# Patient Record
Sex: Female | Born: 1980 | Race: Black or African American | Hispanic: No | Marital: Single | Smoking: Never smoker
Health system: Southern US, Community
[De-identification: ages and names within clinical notes are randomized; demographics above are authoritative.]

## PROBLEM LIST (undated history)

## (undated) DIAGNOSIS — R519 Headache, unspecified: Secondary | ICD-10-CM

## (undated) DIAGNOSIS — L409 Psoriasis, unspecified: Secondary | ICD-10-CM

## (undated) DIAGNOSIS — L309 Dermatitis, unspecified: Secondary | ICD-10-CM

## (undated) HISTORY — PX: DILATION AND CURETTAGE OF UTERUS: SHX78

## (undated) HISTORY — DX: Dermatitis, unspecified: L30.9

---

## 2007-07-26 ENCOUNTER — Emergency Department: Payer: Self-pay | Admitting: Emergency Medicine

## 2007-08-10 ENCOUNTER — Emergency Department: Payer: Self-pay | Admitting: Emergency Medicine

## 2007-08-12 ENCOUNTER — Ambulatory Visit: Payer: Self-pay

## 2007-08-25 ENCOUNTER — Emergency Department: Payer: Self-pay | Admitting: Emergency Medicine

## 2007-09-10 ENCOUNTER — Other Ambulatory Visit: Payer: Self-pay

## 2007-09-10 ENCOUNTER — Emergency Department: Payer: Self-pay | Admitting: Emergency Medicine

## 2007-09-13 ENCOUNTER — Emergency Department: Payer: Self-pay

## 2007-10-11 ENCOUNTER — Emergency Department: Payer: Self-pay | Admitting: Emergency Medicine

## 2007-10-14 ENCOUNTER — Ambulatory Visit: Payer: Self-pay | Admitting: Unknown Physician Specialty

## 2007-10-18 ENCOUNTER — Emergency Department: Payer: Self-pay | Admitting: Internal Medicine

## 2007-10-30 ENCOUNTER — Emergency Department: Payer: Self-pay | Admitting: Emergency Medicine

## 2007-11-05 ENCOUNTER — Ambulatory Visit: Payer: Self-pay | Admitting: Emergency Medicine

## 2007-11-30 ENCOUNTER — Emergency Department: Payer: Self-pay | Admitting: Emergency Medicine

## 2007-12-10 ENCOUNTER — Emergency Department: Payer: Self-pay | Admitting: Emergency Medicine

## 2007-12-27 ENCOUNTER — Emergency Department: Payer: Self-pay | Admitting: Emergency Medicine

## 2007-12-28 ENCOUNTER — Emergency Department: Payer: Self-pay | Admitting: Emergency Medicine

## 2008-02-03 ENCOUNTER — Observation Stay: Payer: Self-pay | Admitting: Obstetrics and Gynecology

## 2008-02-15 ENCOUNTER — Observation Stay: Payer: Self-pay

## 2008-02-25 ENCOUNTER — Observation Stay: Payer: Self-pay

## 2008-03-16 ENCOUNTER — Observation Stay: Payer: Self-pay

## 2008-03-20 ENCOUNTER — Observation Stay: Payer: Self-pay

## 2008-04-25 ENCOUNTER — Observation Stay: Payer: Self-pay

## 2008-04-29 ENCOUNTER — Observation Stay: Payer: Self-pay | Admitting: Unknown Physician Specialty

## 2008-04-30 ENCOUNTER — Observation Stay: Payer: Self-pay | Admitting: Unknown Physician Specialty

## 2008-05-15 ENCOUNTER — Observation Stay: Payer: Self-pay

## 2008-05-18 ENCOUNTER — Observation Stay: Payer: Self-pay

## 2008-05-25 ENCOUNTER — Observation Stay: Payer: Self-pay | Admitting: Obstetrics and Gynecology

## 2008-05-31 ENCOUNTER — Inpatient Hospital Stay: Payer: Self-pay

## 2008-06-17 ENCOUNTER — Emergency Department: Payer: Self-pay | Admitting: Unknown Physician Specialty

## 2008-09-03 ENCOUNTER — Emergency Department: Payer: Self-pay | Admitting: Emergency Medicine

## 2009-05-14 ENCOUNTER — Emergency Department: Payer: Self-pay | Admitting: Emergency Medicine

## 2009-06-10 IMAGING — US US OB < 14 WEEKS - US OB TV
1 series · 17 of 28 positions shown · non-contrast
Comparison: none

REASON FOR EXAM: Rule out ectopic, AB
COMMENTS:

[Series 1: us ob < 14 weeks - us ob tv · 17 of 86 slices shown]
[im 1/86]
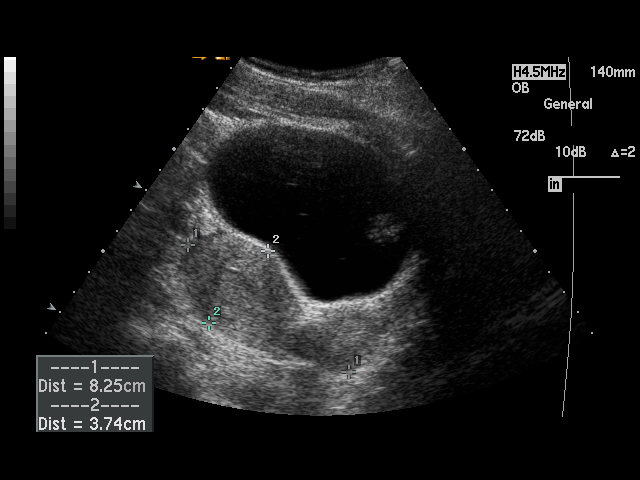
[im 7/86]
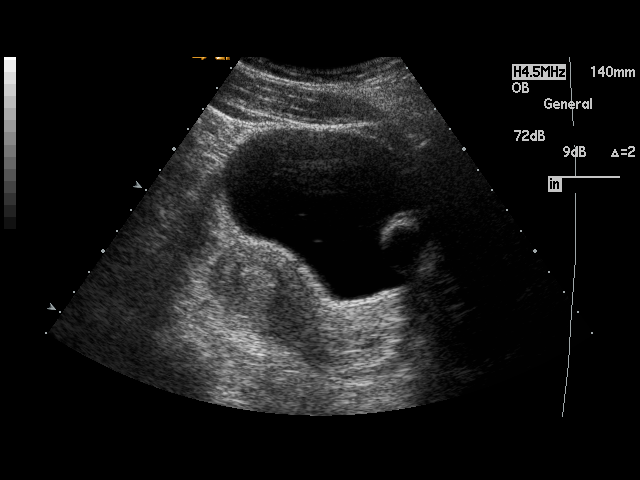
[im 13/86]
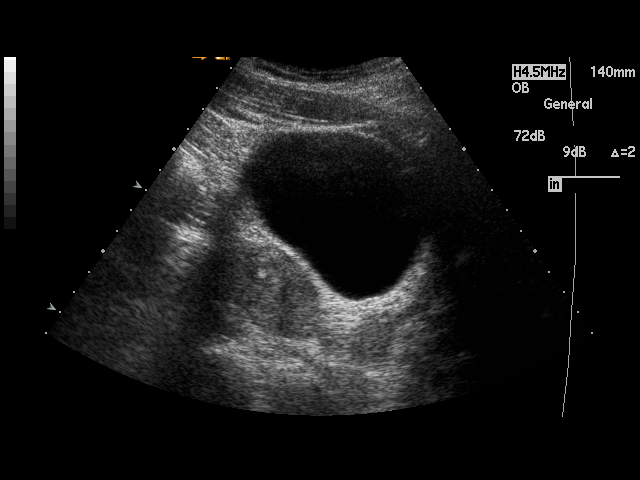
[im 16/86]
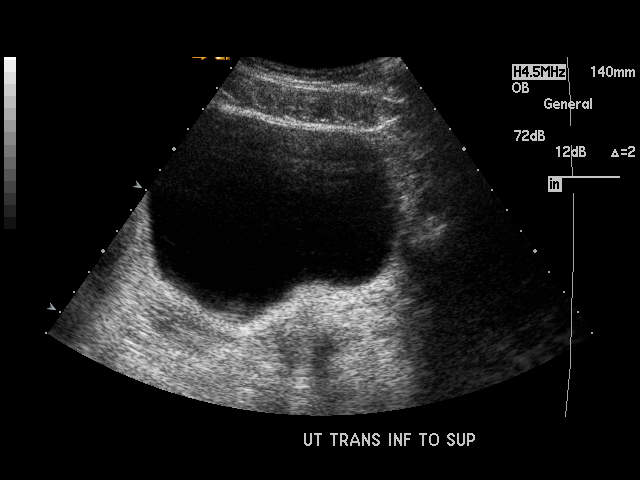
[im 23/86]
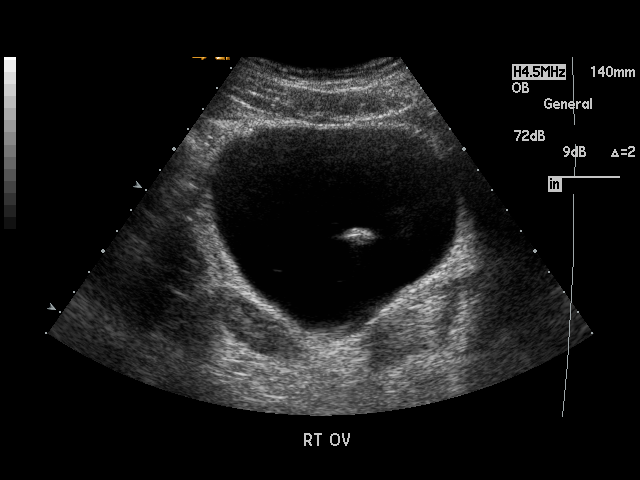
[im 29/86]
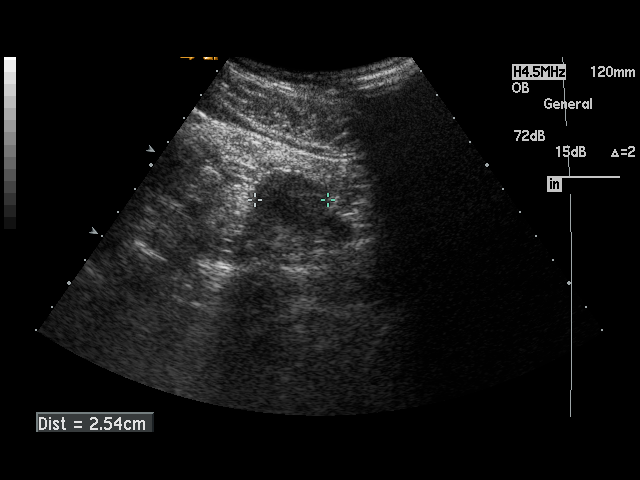
[im 32/86]
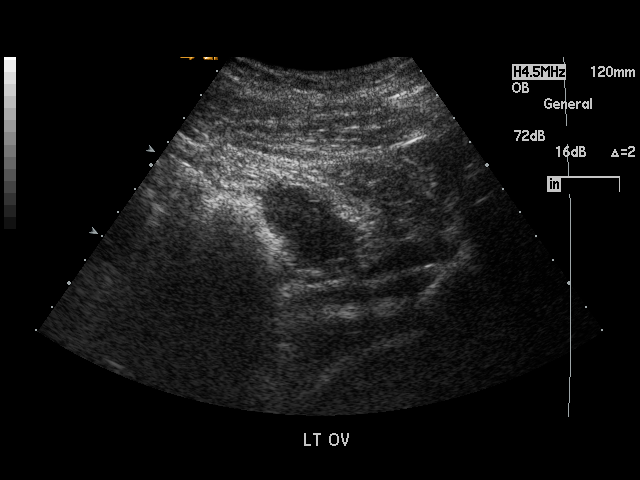
[im 38/86]
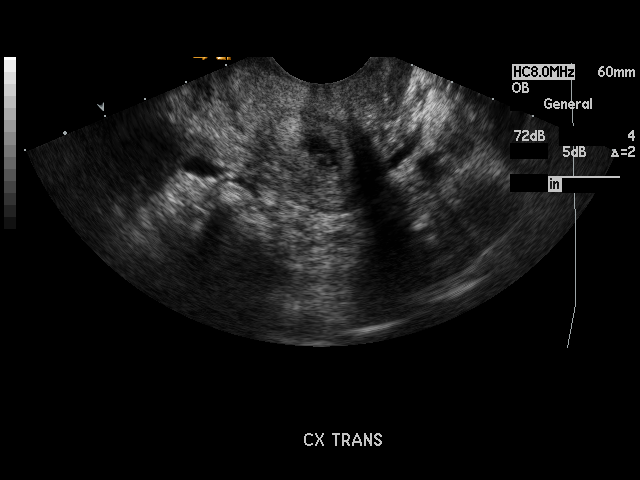
[im 45/86]
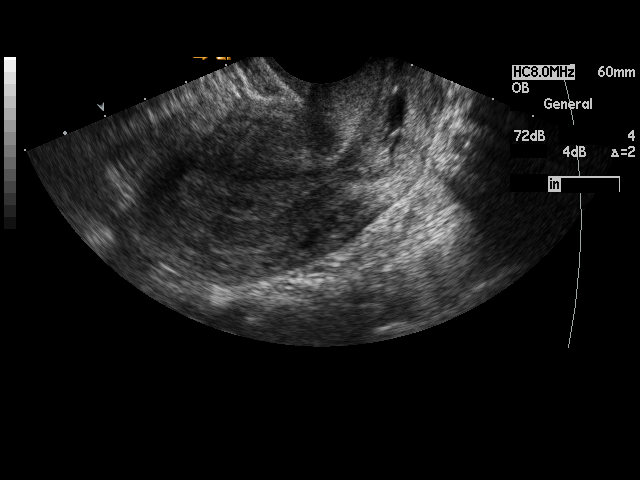
[im 48/86]
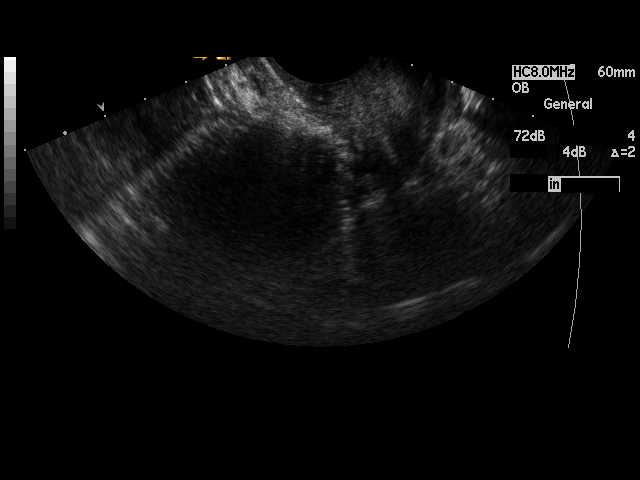
[im 54/86]
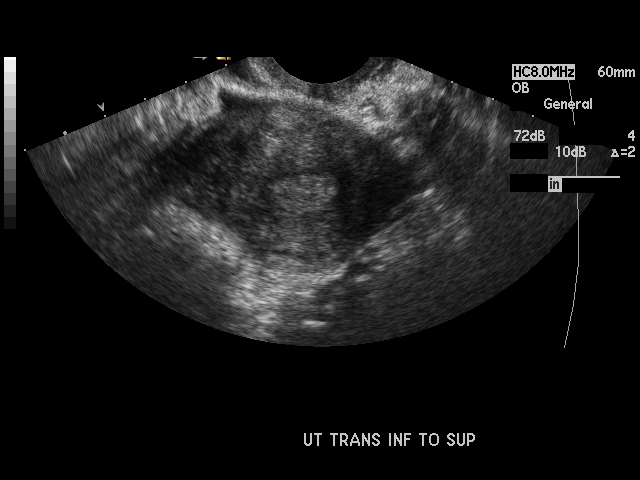
[im 57/86]
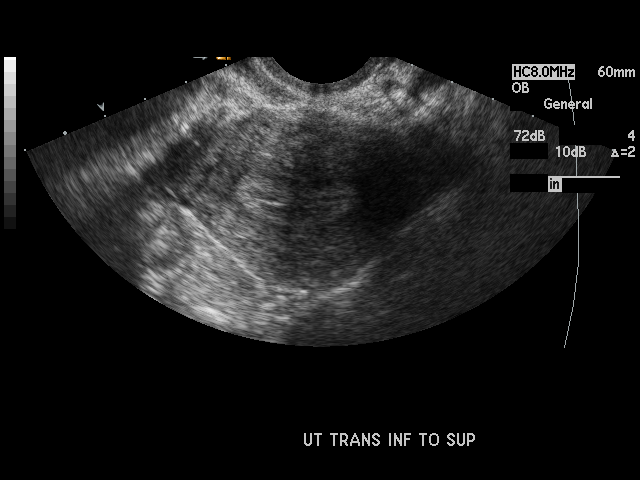
[im 63/86]
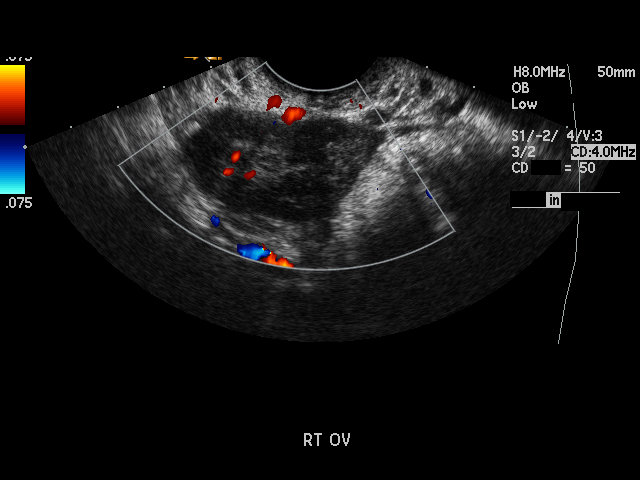
[im 70/86]
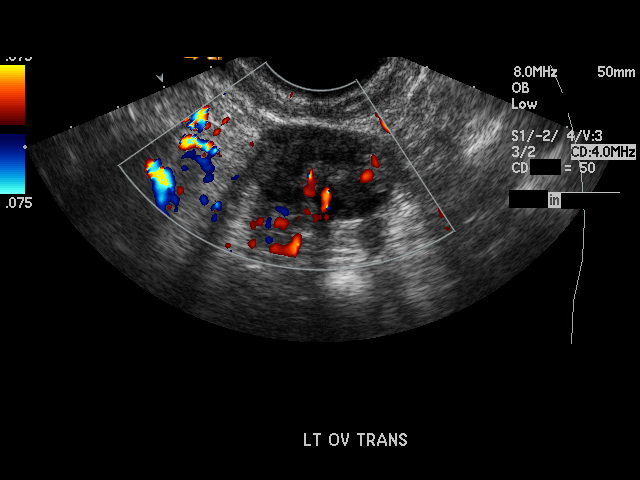
[im 73/86]
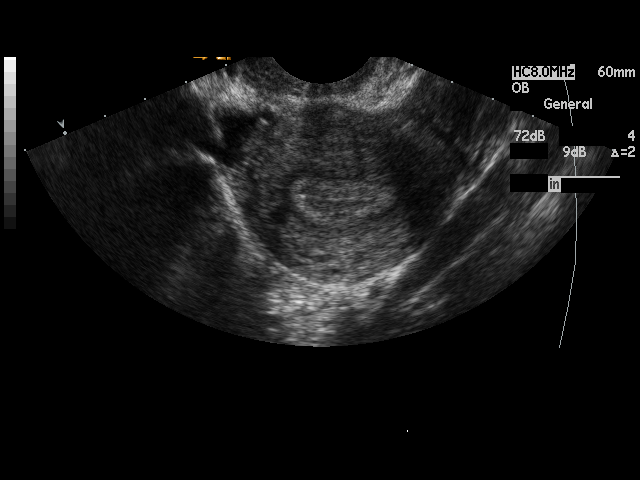
[im 79/86]
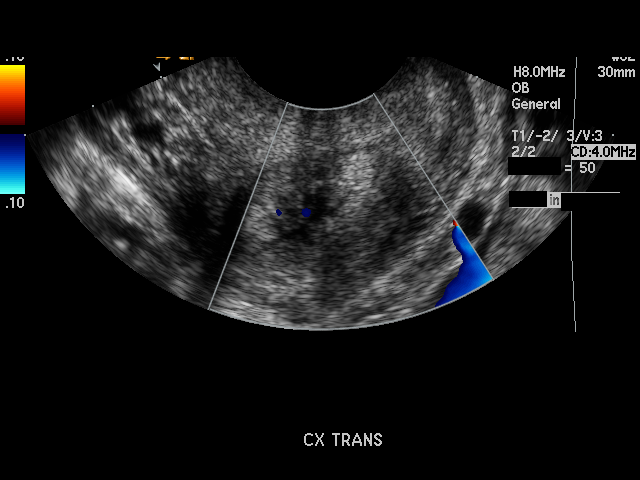
[im 86/86]
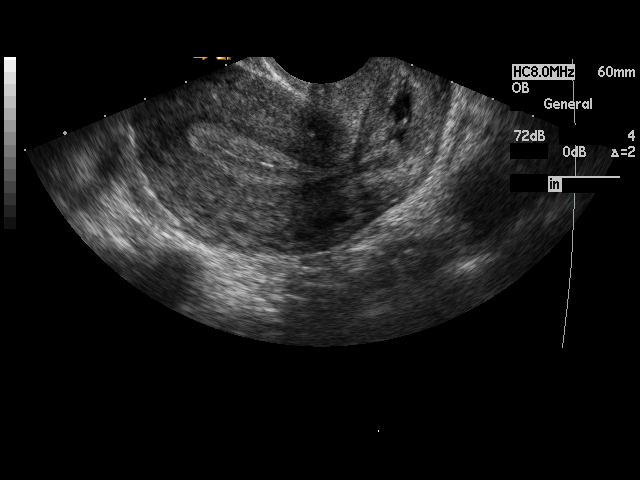

[17 of 28 positions shown; findings below may reference images not displayed]

PROCEDURE:     US  - US OB LESS THAN 14 WEEKS  - August 10, 2007 [DATE]

RESULT:     The patient is in the early trimester of pregnancy and is
spotting.

There are findings that likely reflect fetal parts in the endocervical
canal. There is a structure that may reflect a tiny yolk sac with fetal
pole. No cardiac activity is seen. The endometrial stripe is mildly
thickened at approximately 12.0 mm. No endometrial fluid collection is
identified. The ovaries are normal in appearance. No cystic or solid mass in
the adnexal regions is seen.
IMPRESSION: There are findings consistent with an impending
miscarriage. The fetal parts are felt to likely lie within the endocervical
canal. No fetal cardiac activity is identified.

This report was called to the [HOSPITAL] the conclusion of the
study.

## 2009-09-05 IMAGING — US US OB < 14 WEEKS - US OB TV
1 series · 14 of 28 positions shown · non-contrast
Comparison: none

REASON FOR EXAM: pelvic pain
COMMENTS:

[Series 1: us ob < 14 weeks - us ob tv · 0.14mm/px · 14 of 34 slices shown]
[im 2/34]
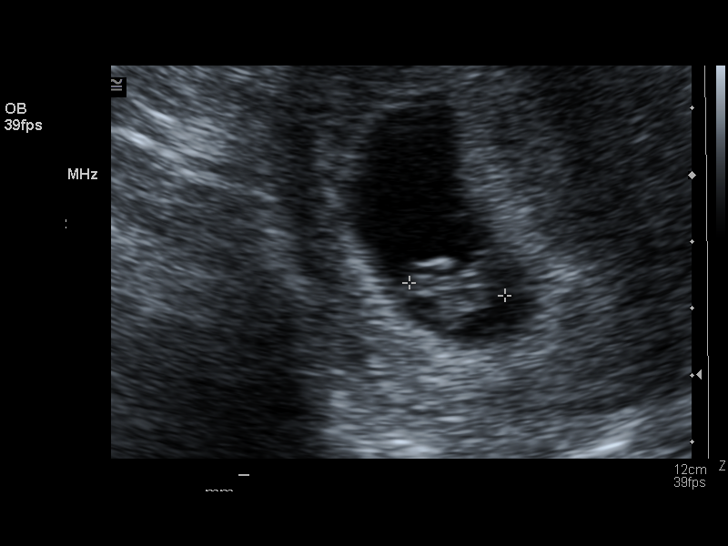
[im 4/34]
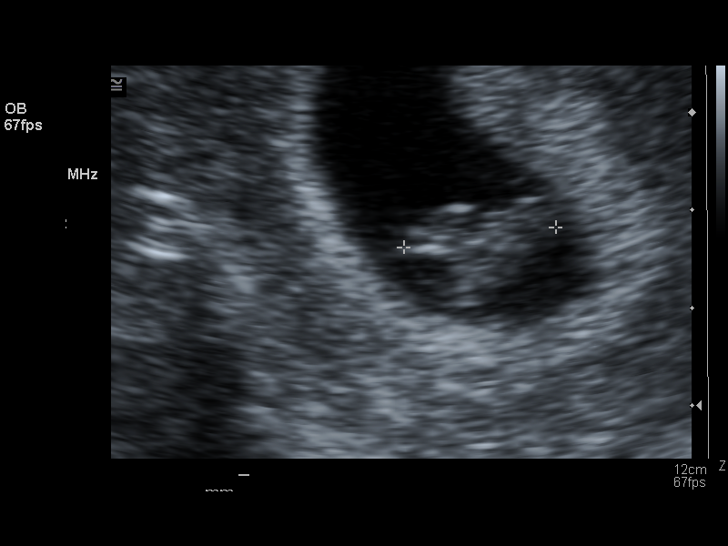
[im 7/34]
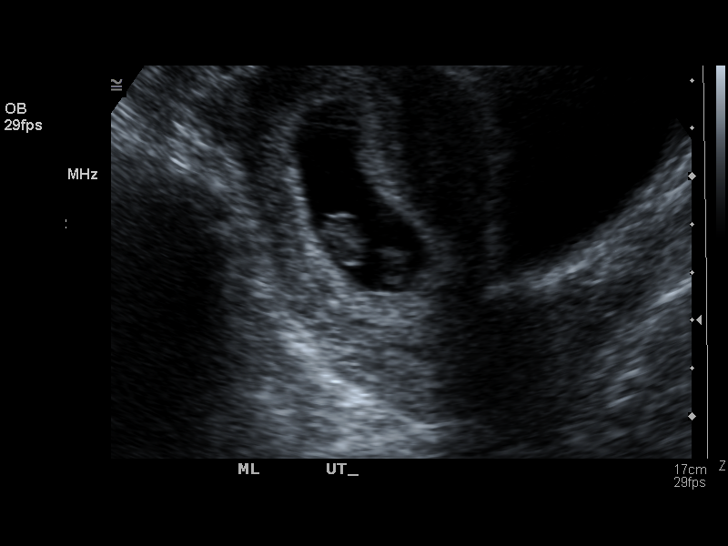
[im 9/34]
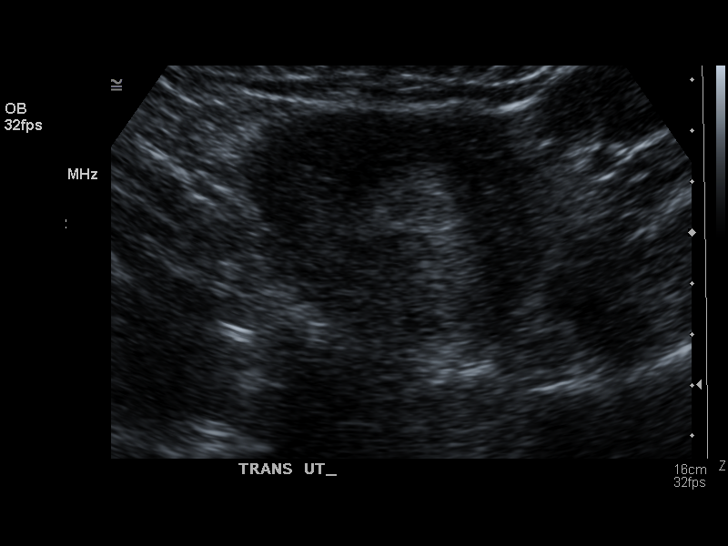
[im 12/34]
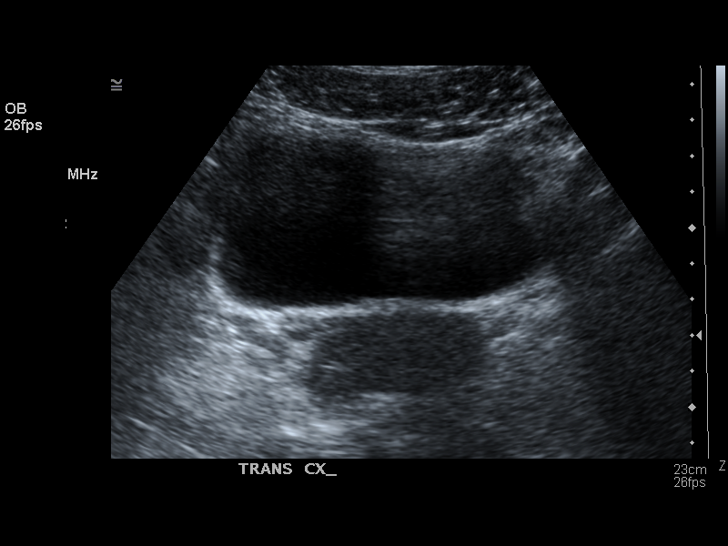
[im 14/34]
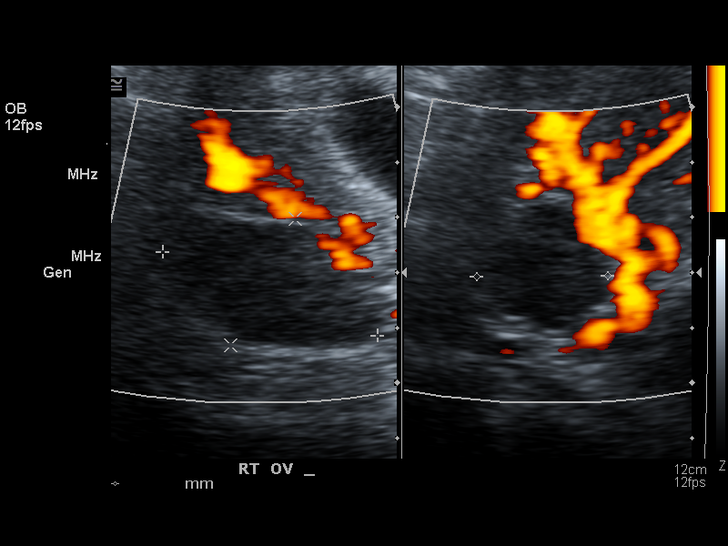
[im 16/34]
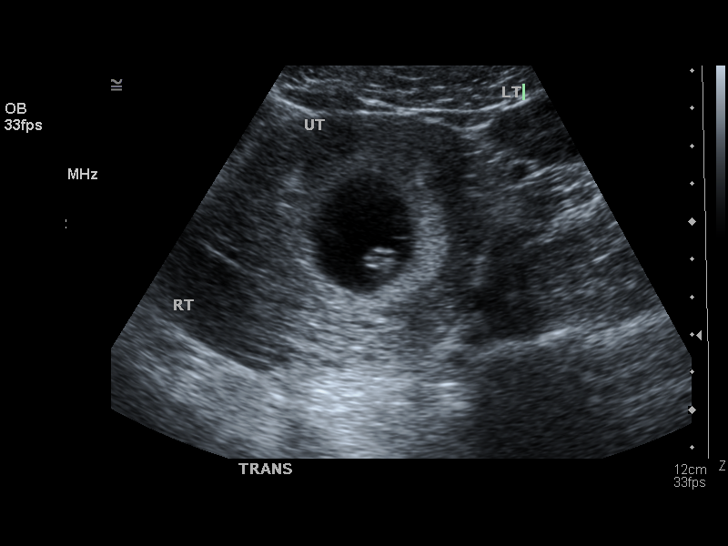
[im 19/34]
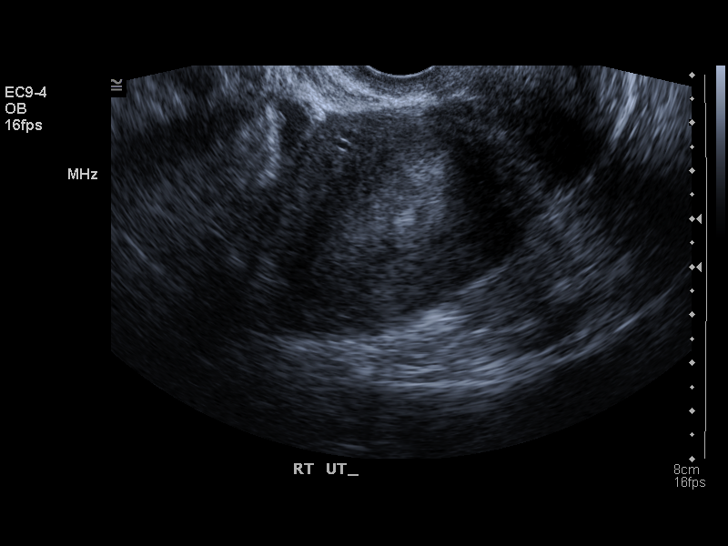
[im 21/34]
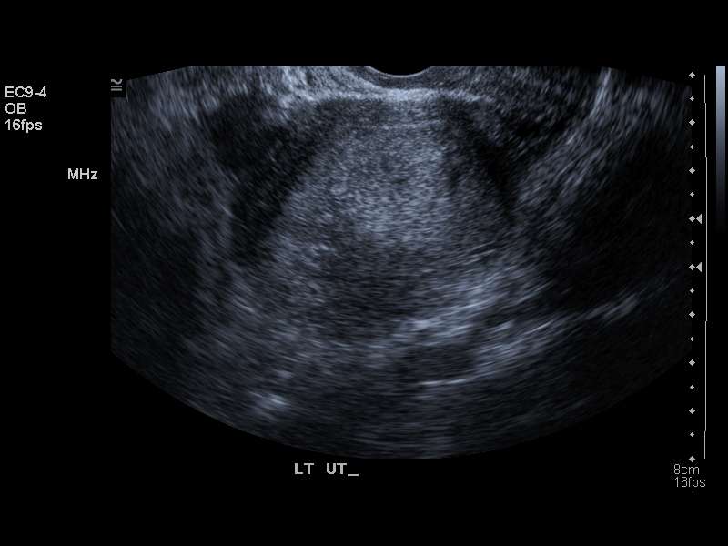
[im 24/34]
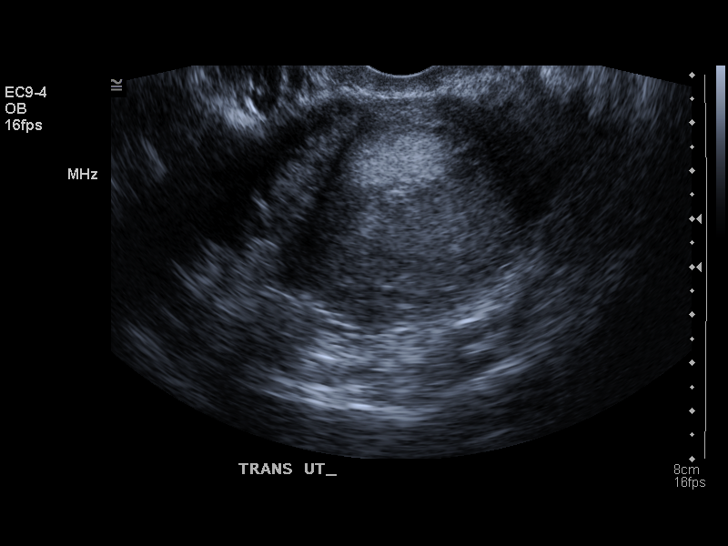
[im 26/34]
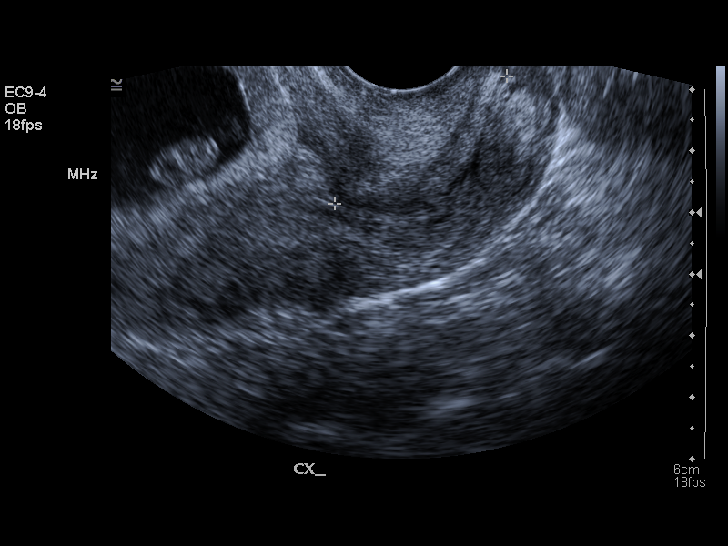
[im 29/34]
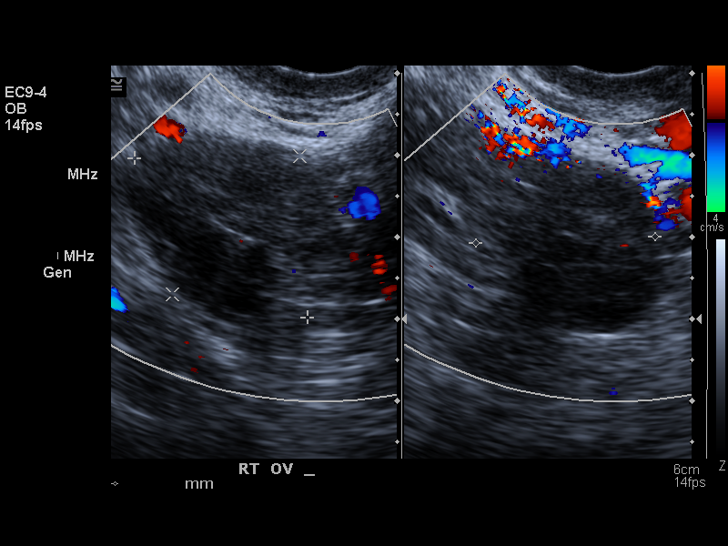
[im 31/34]
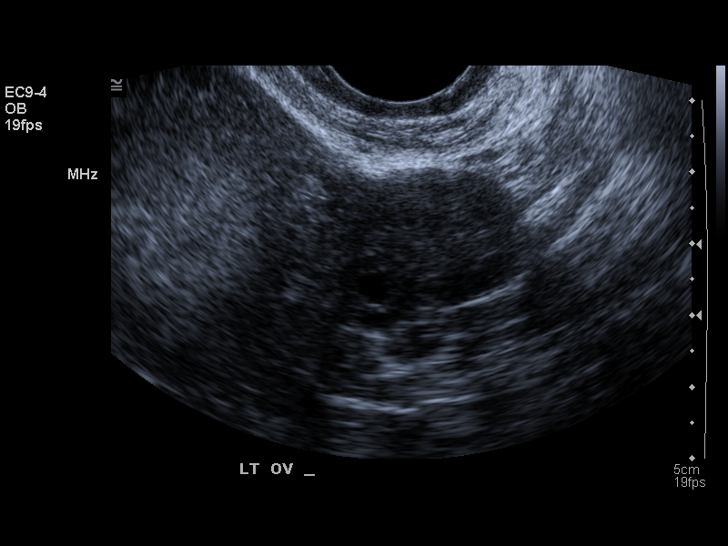
[im 34/34]
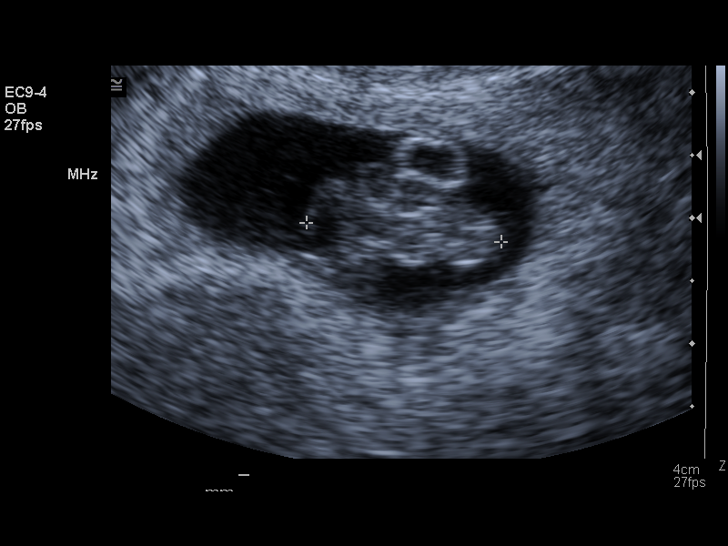

[14 of 28 positions shown; findings below may reference images not displayed]

PROCEDURE:     US  - US OB LESS THAN 14 WEEKS  - November 05, 2007  [DATE]

RESULT:     There is a gravid uterus present. The crown-rump length measures
15.7 mm corresponding to an 8 week-0 day gestation. Cardiac activity at a
rate of 168 beats per minute was demonstrated. There is no evidence of
subchorionic hemorrhage. The maternal ovaries are normal in appearance.
IMPRESSION: 1.There is a viable IUP with gestational age of approximately 8 weeks-3 days
with an estimated date of confinement 16 June, 2008. Follow-up [HOSPITAL]
approximately 20 weeks is recommended. No free fluid is seen in the maternal
pelvis and the adnexal structures are normal in appearance.

## 2010-04-29 ENCOUNTER — Emergency Department: Payer: Self-pay | Admitting: Emergency Medicine

## 2016-12-31 DIAGNOSIS — B9789 Other viral agents as the cause of diseases classified elsewhere: Secondary | ICD-10-CM | POA: Diagnosis not present

## 2016-12-31 DIAGNOSIS — J069 Acute upper respiratory infection, unspecified: Secondary | ICD-10-CM | POA: Diagnosis not present

## 2017-01-14 DIAGNOSIS — L219 Seborrheic dermatitis, unspecified: Secondary | ICD-10-CM | POA: Diagnosis not present

## 2017-01-14 DIAGNOSIS — L400 Psoriasis vulgaris: Secondary | ICD-10-CM | POA: Diagnosis not present

## 2017-03-20 DIAGNOSIS — M545 Low back pain: Secondary | ICD-10-CM | POA: Diagnosis not present

## 2017-04-16 DIAGNOSIS — Z0000 Encounter for general adult medical examination without abnormal findings: Secondary | ICD-10-CM | POA: Diagnosis not present

## 2017-04-16 DIAGNOSIS — Z131 Encounter for screening for diabetes mellitus: Secondary | ICD-10-CM | POA: Diagnosis not present

## 2017-04-16 DIAGNOSIS — Z124 Encounter for screening for malignant neoplasm of cervix: Secondary | ICD-10-CM | POA: Diagnosis not present

## 2017-05-18 DIAGNOSIS — H60339 Swimmer's ear, unspecified ear: Secondary | ICD-10-CM | POA: Diagnosis not present

## 2017-07-02 DIAGNOSIS — H6123 Impacted cerumen, bilateral: Secondary | ICD-10-CM | POA: Diagnosis not present

## 2017-07-23 DIAGNOSIS — M79671 Pain in right foot: Secondary | ICD-10-CM | POA: Diagnosis not present

## 2017-08-07 DIAGNOSIS — M7061 Trochanteric bursitis, right hip: Secondary | ICD-10-CM | POA: Diagnosis not present

## 2017-08-29 DIAGNOSIS — H6121 Impacted cerumen, right ear: Secondary | ICD-10-CM | POA: Diagnosis not present

## 2017-09-14 DIAGNOSIS — J019 Acute sinusitis, unspecified: Secondary | ICD-10-CM | POA: Diagnosis not present

## 2017-09-28 DIAGNOSIS — M722 Plantar fascial fibromatosis: Secondary | ICD-10-CM | POA: Diagnosis not present

## 2017-10-09 DIAGNOSIS — M659 Synovitis and tenosynovitis, unspecified: Secondary | ICD-10-CM | POA: Diagnosis not present

## 2017-10-09 DIAGNOSIS — M79672 Pain in left foot: Secondary | ICD-10-CM | POA: Diagnosis not present

## 2017-11-27 DIAGNOSIS — H6121 Impacted cerumen, right ear: Secondary | ICD-10-CM | POA: Diagnosis not present

## 2017-12-06 DIAGNOSIS — H9201 Otalgia, right ear: Secondary | ICD-10-CM | POA: Diagnosis not present

## 2017-12-06 DIAGNOSIS — H6121 Impacted cerumen, right ear: Secondary | ICD-10-CM | POA: Diagnosis not present

## 2017-12-31 DIAGNOSIS — H6091 Unspecified otitis externa, right ear: Secondary | ICD-10-CM | POA: Diagnosis not present

## 2018-01-02 DIAGNOSIS — J019 Acute sinusitis, unspecified: Secondary | ICD-10-CM | POA: Diagnosis not present

## 2018-02-20 DIAGNOSIS — J019 Acute sinusitis, unspecified: Secondary | ICD-10-CM | POA: Diagnosis not present

## 2018-02-20 DIAGNOSIS — J42 Unspecified chronic bronchitis: Secondary | ICD-10-CM | POA: Diagnosis not present

## 2018-03-06 DIAGNOSIS — H10029 Other mucopurulent conjunctivitis, unspecified eye: Secondary | ICD-10-CM | POA: Diagnosis not present

## 2018-03-10 DIAGNOSIS — L400 Psoriasis vulgaris: Secondary | ICD-10-CM | POA: Diagnosis not present

## 2018-03-10 DIAGNOSIS — L408 Other psoriasis: Secondary | ICD-10-CM | POA: Diagnosis not present

## 2018-03-10 DIAGNOSIS — L219 Seborrheic dermatitis, unspecified: Secondary | ICD-10-CM | POA: Diagnosis not present

## 2018-04-08 DIAGNOSIS — J069 Acute upper respiratory infection, unspecified: Secondary | ICD-10-CM | POA: Diagnosis not present

## 2018-04-08 DIAGNOSIS — R6889 Other general symptoms and signs: Secondary | ICD-10-CM | POA: Diagnosis not present

## 2018-05-01 DIAGNOSIS — R0981 Nasal congestion: Secondary | ICD-10-CM | POA: Diagnosis not present

## 2018-05-01 DIAGNOSIS — R0982 Postnasal drip: Secondary | ICD-10-CM | POA: Diagnosis not present

## 2018-05-01 DIAGNOSIS — J029 Acute pharyngitis, unspecified: Secondary | ICD-10-CM | POA: Diagnosis not present

## 2018-06-02 DIAGNOSIS — R7303 Prediabetes: Secondary | ICD-10-CM | POA: Insufficient documentation

## 2018-06-02 DIAGNOSIS — Z0000 Encounter for general adult medical examination without abnormal findings: Secondary | ICD-10-CM | POA: Diagnosis not present

## 2018-07-05 DIAGNOSIS — R7303 Prediabetes: Secondary | ICD-10-CM | POA: Diagnosis not present

## 2018-07-19 DIAGNOSIS — N898 Other specified noninflammatory disorders of vagina: Secondary | ICD-10-CM | POA: Diagnosis not present

## 2018-12-06 DIAGNOSIS — Z6834 Body mass index (BMI) 34.0-34.9, adult: Secondary | ICD-10-CM | POA: Diagnosis not present

## 2018-12-06 DIAGNOSIS — R7303 Prediabetes: Secondary | ICD-10-CM | POA: Diagnosis not present

## 2019-03-13 DIAGNOSIS — J069 Acute upper respiratory infection, unspecified: Secondary | ICD-10-CM | POA: Diagnosis not present

## 2019-11-03 ENCOUNTER — Other Ambulatory Visit: Payer: Self-pay

## 2019-11-03 DIAGNOSIS — Z20822 Contact with and (suspected) exposure to covid-19: Secondary | ICD-10-CM

## 2019-11-05 LAB — NOVEL CORONAVIRUS, NAA: SARS-CoV-2, NAA: NOT DETECTED

## 2019-11-22 ENCOUNTER — Ambulatory Visit: Payer: 59 | Attending: Internal Medicine

## 2019-11-22 ENCOUNTER — Other Ambulatory Visit: Payer: Self-pay

## 2019-11-22 DIAGNOSIS — Z20822 Contact with and (suspected) exposure to covid-19: Secondary | ICD-10-CM

## 2019-11-24 LAB — NOVEL CORONAVIRUS, NAA: SARS-CoV-2, NAA: NOT DETECTED

## 2020-03-09 DIAGNOSIS — G44019 Episodic cluster headache, not intractable: Secondary | ICD-10-CM | POA: Insufficient documentation

## 2020-03-09 HISTORY — DX: Episodic cluster headache, not intractable: G44.019

## 2020-03-15 ENCOUNTER — Other Ambulatory Visit: Payer: Self-pay

## 2020-03-15 ENCOUNTER — Ambulatory Visit: Payer: 59 | Admitting: Dermatology

## 2020-03-15 DIAGNOSIS — L409 Psoriasis, unspecified: Secondary | ICD-10-CM

## 2020-03-15 NOTE — Progress Notes (Signed)
   Follow-Up Visit   Subjective  Barbara Baker is a 39 y.o. female who presents for the following: Follow-up (Severe seb derm versus psoriasis- Ciclopirox shampoo q2-3 weeks, Dermasmoothe oil qod. Scalp is not worse. Oil helps a lot.).  Her severe seborrheic dermatitis versus psoriasis treatment has not been satisfactory.  She is unable to shampoo her hair very frequently.  We recommend [place] shampooing it more frequently but she feels like she cannot do that.  We discussed Otezla treatment and she wants to pursue this.  There is strong family history of psoriasis   The following portions of the chart were reviewed this encounter and updated as appropriate: Allergies  Meds  Problems  Med Hx  Surg Hx  Fam Hx      Review of Systems: No other skin or systemic complaints.  Objective  Well appearing patient in no apparent distress; mood and affect are within normal limits.  A focused examination was performed including scalp. Relevant physical exam findings are noted in the Assessment and Plan.  Objective  Scalp: Thick scale of scalp.  Assessment & Plan  Psoriasis Scalp Not doing well on current therapy and is flared.  She wants to discuss more aggressive treatment.  This involves systemic treatment with side effects we discussed this in detail.  Discussed Otezla vs Biologic treatments. Recommend starting Otezla. Patient declines today due to recent diagnosis of migraines.  Continue DermaSmoothe FS oil qod. Recommend wash scalp at least once weekly with the ciclopirox shampoo. Patient does not need refills now. She will have pharmacy call when she needs refills.  Return in about 6 months (around 09/14/2020).   I, Ashok Cordia, CMA, am acting as scribe for Sarina Ser, MD . Documentation: I have reviewed the above documentation for accuracy and completeness, and I agree with the above.  Sarina Ser, MD

## 2020-03-19 ENCOUNTER — Encounter: Payer: Self-pay | Admitting: Dermatology

## 2020-04-23 ENCOUNTER — Ambulatory Visit: Payer: 59 | Attending: Internal Medicine

## 2020-04-23 DIAGNOSIS — Z20822 Contact with and (suspected) exposure to covid-19: Secondary | ICD-10-CM

## 2020-04-24 LAB — NOVEL CORONAVIRUS, NAA: SARS-CoV-2, NAA: NOT DETECTED

## 2020-04-24 LAB — SARS-COV-2, NAA 2 DAY TAT

## 2020-06-23 ENCOUNTER — Ambulatory Visit: Payer: 59 | Attending: Internal Medicine

## 2020-06-23 ENCOUNTER — Other Ambulatory Visit: Payer: Self-pay

## 2020-06-23 DIAGNOSIS — Z23 Encounter for immunization: Secondary | ICD-10-CM

## 2020-06-23 NOTE — Progress Notes (Signed)
   Covid-19 Vaccination Clinic  Name:  Barbara Baker    MRN: [id number] DOB: 11/25/1981  06/23/2020  Barbara Baker was observed post Covid-19 immunization for 15 minutes without incident. She was provided with Vaccine Information Sheet and instruction to access the V-Safe system.   Barbara Baker was instructed to call 911 with any severe reactions post vaccine: Marland Kitchen Difficulty breathing  . Swelling of face and throat  . A fast heartbeat  . A bad rash all over body  . Dizziness and weakness   Immunizations Administered    Name Date Dose VIS Date Route   Pfizer COVID-19 Vaccine 06/23/2020  8:21 AM 0.3 mL 01/25/2019 Intramuscular   Manufacturer: [place]   Lot: [id number]   [place]: [address]-7

## 2020-07-09 ENCOUNTER — Ambulatory Visit: Payer: 59

## 2020-07-16 ENCOUNTER — Ambulatory Visit: Payer: 59 | Attending: Internal Medicine

## 2020-07-16 DIAGNOSIS — Z23 Encounter for immunization: Secondary | ICD-10-CM

## 2020-07-16 NOTE — Progress Notes (Signed)
   Covid-19 Vaccination Clinic  Name:  Barbara Baker    MRN: [id number] DOB: [birth year]  07/16/2020  [name] was observed post Covid-19 immunization for 15 minutes without incident. She was provided with Vaccine Information Sheet and instruction to access the V-Safe system.   [name] was instructed to call 911 with any severe reactions post vaccine: Marland Kitchen Difficulty breathing  . Swelling of face and throat  . A fast heartbeat  . A bad rash all over body  . Dizziness and weakness   Immunizations Administered    Name Date Dose VIS Date Route   Pfizer COVID-19 Vaccine 07/16/2020  4:22 PM 0.3 mL 01/25/2019 Intramuscular   Manufacturer: [place]   Lot: [id number]   [place]: [address]-3

## 2020-07-30 ENCOUNTER — Telehealth: Payer: Self-pay

## 2020-07-30 MED ORDER — CLOBETASOL PROPIONATE 0.05 % EX FOAM
CUTANEOUS | 0 refills | Status: DC
Start: 2020-07-30 — End: 2020-09-13

## 2020-07-30 NOTE — Telephone Encounter (Signed)
Per patient's son telephone encounter - she wanted to know if Clobetasol came in a foam vehicle and if this is something that could be helpful and prescribed to help with scalp psoriasis.

## 2020-07-30 NOTE — Telephone Encounter (Signed)
RX sent in and patient advised. 

## 2020-07-30 NOTE — Telephone Encounter (Signed)
Yes, clobetasol does come in foam. May send Rx to use up to 5 days per week scalp. Shampoo scalp ideally at least 3 days per week. (we felt that the oil would be more appropriate, but foam will work too)

## 2020-09-13 ENCOUNTER — Other Ambulatory Visit: Payer: Self-pay

## 2020-09-13 ENCOUNTER — Encounter: Payer: Self-pay | Admitting: Dermatology

## 2020-09-13 ENCOUNTER — Ambulatory Visit: Payer: 59 | Admitting: Dermatology

## 2020-09-13 DIAGNOSIS — L739 Follicular disorder, unspecified: Secondary | ICD-10-CM

## 2020-09-13 DIAGNOSIS — L409 Psoriasis, unspecified: Secondary | ICD-10-CM

## 2020-09-13 DIAGNOSIS — L810 Postinflammatory hyperpigmentation: Secondary | ICD-10-CM | POA: Diagnosis not present

## 2020-09-13 DIAGNOSIS — L219 Seborrheic dermatitis, unspecified: Secondary | ICD-10-CM | POA: Diagnosis not present

## 2020-09-13 MED ORDER — HYDROCORTISONE 2.5 % EX LOTN: TOPICAL_LOTION | Freq: Every day | CUTANEOUS | 6 refills | Status: DC

## 2020-09-13 MED ORDER — FLUOCINOLONE ACETONIDE BODY 0.01 % EX OIL: 1.0000 "application " | TOPICAL_OIL | Freq: Every day | CUTANEOUS | 6 refills | Status: DC

## 2020-09-13 MED ORDER — CLOBETASOL PROPIONATE 0.05 % EX FOAM: CUTANEOUS | 6 refills | Status: DC

## 2020-09-13 MED ORDER — CICLOPIROX 1 % EX SHAM: 1.0000 "application " | MEDICATED_SHAMPOO | Freq: Every day | CUTANEOUS | 6 refills | Status: DC | PRN

## 2020-09-13 MED ORDER — KETOCONAZOLE 2 % EX CREA: 1.0000 "application " | TOPICAL_CREAM | Freq: Every day | CUTANEOUS | 6 refills | Status: DC

## 2020-09-13 NOTE — Progress Notes (Signed)
° °  Follow-Up Visit   Subjective  Barbara Baker is a 39 y.o. female who presents for the following: Psoriasis (follow up - scalp. Treating with Clobetasol foam 1-2 times per week, DermaSmoothe ~every other day) and Other (Dark spot on right face).  The following portions of the chart were reviewed this encounter and updated as appropriate:  Tobacco   Allergies   Meds   Problems   Med Hx   Surg Hx   Fam Hx      Review of Systems:  No other skin or systemic complaints except as noted in HPI or Assessment and Plan.  Objective  Well appearing patient in no apparent distress; mood and affect are within normal limits.  A focused examination was performed including face, scalp. Relevant physical exam findings are noted in the Assessment and Plan.  Objective  Scalp: Minimal scale.  Objective  Face: Mild scale of nasolabial areas.  Objective  Face, neck: Follicular based spotty hyperpigmentation of cheeks and neck  Objective  Face: Follicular based spotty hyperpigmentation of cheeks and neck   Assessment & Plan  Psoriasis -persistent but improved Scalp Continue DermaSmoothe oil qd prn, Clobetasol foam qd up to 5 times per week as needed  Recommend shampooing scalp more frequently. 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  I, Ashok Cordia, CMA, am acting as scribe for Sarina Ser, MD .  Documentation: I have reviewed the above documentation for accuracy and completeness, and I agree with the above.  Sarina Ser, MD

## 2020-09-13 NOTE — Patient Instructions (Signed)
Instructions for [facility] Medications  One or more of your medications was sent to the [facility] mail order compounding pharmacy. You will receive an email from them and can purchase the medicine through that link. It will then be mailed to your home at the address you confirmed. If for any reason you do not receive an email from them, please check your spam folder. If you still do not find the email, please let us know. [facility] phone number is [phone].   

## 2020-09-25 ENCOUNTER — Other Ambulatory Visit: Payer: Self-pay

## 2020-09-25 DIAGNOSIS — L409 Psoriasis, unspecified: Secondary | ICD-10-CM

## 2020-09-25 MED ORDER — CLOBETASOL PROPIONATE 0.05 % EX FOAM: CUTANEOUS | 6 refills | Status: DC

## 2020-09-25 NOTE — Progress Notes (Signed)
Per fax request - switching Clobetasol to [facility].

## 2020-12-28 ENCOUNTER — Other Ambulatory Visit: Payer: 59

## 2021-01-10 ENCOUNTER — Other Ambulatory Visit: Payer: Self-pay | Admitting: Otolaryngology

## 2021-01-10 DIAGNOSIS — R519 Headache, unspecified: Secondary | ICD-10-CM

## 2021-01-10 DIAGNOSIS — R42 Dizziness and giddiness: Secondary | ICD-10-CM

## 2021-01-14 ENCOUNTER — Ambulatory Visit: Payer: 59

## 2021-01-16 ENCOUNTER — Ambulatory Visit: Payer: 59 | Admitting: Dermatology

## 2021-01-16 ENCOUNTER — Other Ambulatory Visit: Payer: Self-pay

## 2021-01-16 DIAGNOSIS — L219 Seborrheic dermatitis, unspecified: Secondary | ICD-10-CM

## 2021-01-16 DIAGNOSIS — L409 Psoriasis, unspecified: Secondary | ICD-10-CM | POA: Diagnosis not present

## 2021-01-16 MED ORDER — KETOCONAZOLE 2 % EX CREA: 1.0000 "application " | TOPICAL_CREAM | CUTANEOUS | 6 refills | Status: DC

## 2021-01-16 MED ORDER — FLUOCINOLONE ACETONIDE BODY 0.01 % EX OIL: 1.0000 "application " | TOPICAL_OIL | Freq: Every day | CUTANEOUS | 6 refills | Status: DC

## 2021-01-16 MED ORDER — CICLOPIROX 1 % EX SHAM: 1.0000 "application " | MEDICATED_SHAMPOO | CUTANEOUS | 6 refills | Status: DC

## 2021-01-16 MED ORDER — HYDROCORTISONE 2.5 % EX LOTN: TOPICAL_LOTION | CUTANEOUS | 6 refills | Status: DC

## 2021-01-16 MED ORDER — CLOBETASOL PROPIONATE 0.05 % EX FOAM: CUTANEOUS | 6 refills | Status: DC

## 2021-01-16 NOTE — Progress Notes (Unsigned)
   Follow-Up Visit   Subjective  Barbara Baker is a 40 y.o. female who presents for the following: Psoriasis (Scalp 18m f/u, improved, Clobetasol foam up to 5d/wk, Ciclopirox shampoo 1x/m, Fluocinoline oil 1x/wk) and Seborrheic Dermatitis (Sebo psoriasis, face, not using Ketoconazole cream or HC 2.5% lotion, pt says she has at home but didn't know what they were for).  Patient accompanied by her son.  The following portions of the chart were reviewed this encounter and updated as appropriate:   Tobacco  Allergies  Meds  Problems  Med Hx  Surg Hx  Fam Hx     Review of Systems:  No other skin or systemic complaints except as noted in HPI or Assessment and Plan.  Objective  Well appearing patient in no apparent distress; mood and affect are within normal limits.  A focused examination was performed including face, scalp. Relevant physical exam findings are noted in the Assessment and Plan.  Objective  Scalp: Scaling scalp  Objective  Face: Mild scale eyebrows   Assessment & Plan  Psoriasis Scalp Psoriasis is a chronic non-curable, but treatable genetic/hereditary disease that may have other systemic features affecting other organ systems such as joints (Psoriatic Arthritis). It is associated with an increased risk of inflammatory bowel disease, heart disease, non-alcoholic fatty liver disease, and depression.     Cont Clobetasol Foam qd up to 5d/wk aa scalp, avoid f/g/a Cont Fluocinolone oil qd up to 5d/wk aa scalp Cont Ciclopirox shampoo trying to increase to weekly  Discussed Calcipotriene foam  Reordered Medications Fluocinolone Acetonide Body 0.01 % OIL clobetasol (OLUX) 0.05 % topical foam Ciclopirox 1 % shampoo  Seborrheic dermatitis Face Sebo Psoriasis Start Ketoconazole 2% cr 3x/wk Monday,Wednesday, Friday hs Start HC 2.5% lotion 3x/wk, Tuesday, Thursday, Saturday hs  Reordered Medications ketoconazole (NIZORAL) 2 % cream hydrocortisone 2.5 %  lotion  Return in about 1 year (around 01/16/2022) for Psoriasis f/u.  I, Othelia Pulling, RMA, am acting as scribe for Sarina Ser, MD .  Documentation: I have reviewed the above documentation for accuracy and completeness, and I agree with the above.  Sarina Ser, MD

## 2021-01-17 ENCOUNTER — Encounter: Payer: Self-pay | Admitting: Dermatology

## 2021-01-22 ENCOUNTER — Ambulatory Visit: Payer: 59 | Admitting: Dermatology

## 2021-06-27 ENCOUNTER — Encounter: Payer: Self-pay | Admitting: Dermatology

## 2021-07-09 ENCOUNTER — Ambulatory Visit: Payer: 59 | Admitting: Dermatology

## 2021-09-02 ENCOUNTER — Ambulatory Visit: Payer: 59 | Admitting: Dermatology

## 2021-09-03 ENCOUNTER — Ambulatory Visit: Payer: 59 | Admitting: Dermatology

## 2021-09-04 ENCOUNTER — Ambulatory Visit: Payer: 59 | Admitting: Dermatology

## 2021-11-04 ENCOUNTER — Ambulatory Visit: Payer: 59 | Admitting: Dermatology

## 2022-01-16 ENCOUNTER — Other Ambulatory Visit: Payer: Self-pay

## 2022-01-16 ENCOUNTER — Ambulatory Visit: Payer: 59 | Admitting: Dermatology

## 2022-01-16 DIAGNOSIS — L409 Psoriasis, unspecified: Secondary | ICD-10-CM

## 2022-01-16 DIAGNOSIS — L910 Hypertrophic scar: Secondary | ICD-10-CM

## 2022-01-16 MED ORDER — CLOBETASOL PROPIONATE 0.05 % EX FOAM: CUTANEOUS | 6 refills | Status: DC

## 2022-01-16 MED ORDER — FLUOCINOLONE ACETONIDE BODY 0.01 % EX OIL: 1.0000 "application " | TOPICAL_OIL | Freq: Every day | CUTANEOUS | 6 refills | Status: DC

## 2022-01-16 MED ORDER — CICLOPIROX 1 % EX SHAM: 1.0000 "application " | MEDICATED_SHAMPOO | CUTANEOUS | 6 refills | Status: DC

## 2022-01-16 NOTE — Patient Instructions (Addendum)
Intralesional steroid injection side effects were reviewed including thinning of the skin and discoloration, such as redness, lightening or darkening.  Topical steroids (such as triamcinolone, fluocinolone, fluocinonide, mometasone, clobetasol, halobetasol, betamethasone, hydrocortisone) can cause thinning and lightening of the skin if they are used for too long in the same area. Your physician has selected the right strength medicine for your problem and area affected on the body. Please use your medication only as directed by your physician to prevent side effects.   If You Need Anything After Your Visit  If you have any questions or concerns for your doctor, please call our main line at [phone] and press option 4 to reach your doctor's medical assistant. If no one answers, please leave a voicemail as directed and we will return your call as soon as possible. Messages left after 4 pm will be answered the following business day.   You may also send [place] a message via [place]. We typically respond to MyChart messages within 1-2 business days.  For prescription refills, please ask your pharmacy to contact our office. Our fax number is [phone].  If you have an urgent issue when the clinic is closed that cannot wait until the next business day, you can page your doctor at the number below.    Please note that while we do our best to be available for urgent issues outside of office hours, we are not available 24/7.   If you have an urgent issue and are unable to reach [place], you may choose to seek medical care at your doctor's office, retail clinic, urgent care center, or emergency room.  If you have a medical emergency, please immediately call 911 or go to the emergency department.  Pager Numbers  - Dr. Nehemiah Massed: [phone]  - Dr. Laurence Ferrari: [phone]  - Dr. Nicole Kindred: [phone]  In the event of inclement weather, please call our main line at [phone] for an update on the status of  any delays or closures.  Dermatology Medication Tips: Please keep the boxes that topical medications come in in order to help keep track of the instructions about where and how to use these. Pharmacies typically print the medication instructions only on the boxes and not directly on the medication tubes.   If your medication is too expensive, please contact our office at [phone] option 4 or send [place] a message through [place].   We are unable to tell what your co-pay for medications will be in advance as this is different depending on your insurance coverage. However, we may be able to find a substitute medication at lower cost or fill out paperwork to get insurance to cover a needed medication.   If a prior authorization is required to get your medication covered by your insurance company, please allow [place] 1-2 business days to complete this process.  Drug prices often vary depending on where the prescription is filled and some pharmacies may offer cheaper prices.  The website www.goodrx.com contains coupons for medications through different pharmacies. The prices here do not account for what the cost may be with help from insurance (it may be cheaper with your insurance), but the website can give you the price if you did not use any insurance.  - You can print the associated coupon and take it with your prescription to the pharmacy.  - You may also stop by our office during regular business hours and pick up a GoodRx coupon card.  - If you need your prescription sent electronically to  a different pharmacy, notify our office through [facility] or by phone at [phone] option 4.     Si Usted Necesita Algo Despus de Su Visita  Tambin puede enviarnos un mensaje a travs de Pharmacist, community. Por lo general respondemos a los mensajes de MyChart en el transcurso de 1 a 2 das hbiles.  Para renovar recetas, por favor pida a su farmacia que se ponga en contacto con nuestra oficina. Harland Dingwall de fax es [place] [phone].  Si tiene un asunto urgente cuando la clnica est cerrada y que no puede esperar hasta el siguiente da hbil, puede llamar/localizar a su doctor(a) al nmero que aparece a continuacin.   Por favor, tenga en cuenta que aunque hacemos todo lo posible para estar disponibles para asuntos urgentes fuera del horario de [place], no estamos disponibles las 24 horas del da, los 7 das de la [place].   Si tiene un problema urgente y no puede comunicarse con nosotros, puede optar por buscar atencin mdica  en el consultorio de su doctor(a), en una clnica privada, en un centro de atencin urgente o en una sala de emergencias.  Si tiene Engineering geologist, por favor llame inmediatamente al 911 o vaya a la sala de emergencias.  Nmeros de bper  - Dr. Nehemiah Massed: [phone]  - Dra. Moye: [phone]  - Dra. Nicole Kindred: [phone]  En caso de inclemencias del [place], por favor llame a Johnsie Kindred principal al [phone] para una actualizacin sobre el [place] de cualquier retraso o cierre.  Consejos para la medicacin en dermatologa: Por favor, guarde las cajas en las que vienen los medicamentos de uso tpico para ayudarle a seguir las instrucciones sobre dnde y cmo usarlos. Las farmacias generalmente imprimen las instrucciones del medicamento slo en las cajas y no directamente en los tubos del [place].   Si su medicamento es muy caro, por favor, pngase en contacto con Zigmund Daniel llamando al [phone] y presione la opcin 4 o envenos un mensaje a travs de Pharmacist, community.   No podemos decirle cul ser su copago por los medicamentos por adelantado ya que esto es diferente dependiendo de la cobertura de su seguro. Sin embargo, es posible que podamos encontrar un medicamento sustituto a Electrical engineer un formulario para que el seguro cubra el medicamento que se considera necesario.   Si se requiere una autorizacin previa para que su compaa de  seguros [place] su medicamento, por favor permtanos de 1 a 2 das hbiles para completar este proceso.  Los precios de los medicamentos varan con frecuencia dependiendo del Environmental consultant de dnde se surte la receta y alguna farmacias pueden ofrecer precios ms baratos.  El sitio web www.goodrx.com tiene cupones para medicamentos de Airline pilot. Los precios aqu no tienen en cuenta lo que podra costar con la ayuda del seguro (puede ser ms barato con su seguro), pero el sitio web puede darle el precio si no utiliz Research scientist (physical sciences).  - Puede imprimir el cupn correspondiente y llevarlo con su receta a la farmacia.  - Tambin puede pasar por nuestra oficina durante el horario de atencin regular y Charity fundraiser una tarjeta de cupones de GoodRx.  - Si necesita que su receta se enve electrnicamente a una farmacia diferente, informe a nuestra oficina a travs de MyChart de [place] o por telfono llamando al [phone] y presione la opcin 4.

## 2022-01-16 NOTE — Progress Notes (Signed)
Follow-Up Visit   Subjective  Barbara Baker is a 41 y.o. female who presents for the following: Follow-up (Patient here today for 1 year follow up on psoriasis. Patient also reports a keloid at her naval area that has been bothering her. She reports history of piercing in area and developed keloids. ).  The following portions of the chart were reviewed this encounter and updated as appropriate:  Tobacco   Allergies   Meds   Problems   Med Hx   Surg Hx   Fam Hx      Review of Systems: No other skin or systemic complaints except as noted in HPI or Assessment and Plan.   Objective  Well appearing patient in no apparent distress; mood and affect are within normal limits.  A focused examination was performed including face, scalp, lower abdomen. Relevant physical exam findings are noted in the Assessment and Plan.  Scalp Some scale at scalp  Umbilicus        Assessment & Plan  Psoriasis Scalp Psoriasis is a chronic non-curable, but treatable genetic/hereditary disease that may have other systemic features affecting other organ systems such as joints (Psoriatic Arthritis). It is associated with an increased risk of inflammatory bowel disease, heart disease, non-alcoholic fatty liver disease, and depression.    Scalp Psoriasis is a chronic non-curable, but treatable genetic/hereditary disease that may have other systemic features affecting other organ systems such as joints (Psoriatic Arthritis). It is associated with an increased risk of inflammatory bowel disease, heart disease, non-alcoholic fatty liver disease, and depression.      Chronic and persistent condition with duration or expected duration over one year. Condition is symptomatic/ bothersome to patient. Not currently at goal.  Rx: Clobetasol Foam qd up to 5d/wk aa scalp, avoid f/g/a Patient using Every 2 - 3 weeks when itchy we would recommend she continue that regimine  Rx :Fluocinolone oil qd up to 5d/wk aa  scalp Patient is using every other day 3 - 4 days weekly , we would recommend she continues that regimine  Rx : Ciclopirox shampoo trying to increase to weekly Patient is using once or twice a month. Would recommend increasing   Discussed Calcipotriene foam   Reordered Medications Fluocinolone Acetonide Body 0.01 % OIL clobetasol (OLUX) 0.05 % topical foam Ciclopirox 1 % shampoo  Related Medications Ciclopirox 1 % shampoo Apply 1 application topically once a week. Apply to scalp let sit 5 minutes and rinse out  Fluocinolone Acetonide Body 0.01 % OIL Apply 1 application topically daily. Qd to aa scalp prn flares  clobetasol (OLUX) 0.05 % topical foam Apply topically as directed. Qd aa psoriasis on scalp up to 5 day per week, avoid face, groin, axilla  Keloid Umbilicus Recommend Intralesional steroid injection side effects were reviewed including thinning of the skin and discoloration, such as redness, lightening or darkening.  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 Informed consent was obtained. Preparation: The area was prepared a standard fashion.  Procedure Details: An intralesional injection was performed with Kenalog 5 mg/cc.  3 cc in total were injected.  Total number  of injections: 10  Plan: The patient was instructed on post-op care. Recommend OTC analgesia as needed for pain.  Ncd [id number] Lot #[id number]  Exp aug 2024  Return for 6 week follow up on keloids, 6 month psoriasis follow up.  IRuthell Rummage, CMA, am acting as scribe for Sarina Ser,  MD. Documentation: I have reviewed the above documentation for accuracy and completeness, and I agree with the above.  Sarina Ser, MD

## 2022-01-25 ENCOUNTER — Encounter: Payer: Self-pay | Admitting: Dermatology

## 2022-02-12 ENCOUNTER — Ambulatory Visit: Payer: 59 | Admitting: Dermatology

## 2022-02-12 ENCOUNTER — Other Ambulatory Visit: Payer: Self-pay

## 2022-02-12 DIAGNOSIS — L910 Hypertrophic scar: Secondary | ICD-10-CM

## 2022-02-12 NOTE — Progress Notes (Signed)
? ?  Follow-Up Visit ?  ?Subjective  ?Barbara Baker is a 41 y.o. female who presents for the following: Follow-up (Patient here today for keloid follow up and ILK injection at umbilicus. ). ? ?Patient accompanied by son.  ? ?The following portions of the chart were reviewed this encounter and updated as appropriate:  ? Tobacco  Allergies  Meds  Problems  Med Hx  Surg Hx  Fam Hx   ?  ?Review of Systems:  No other skin or systemic complaints except as noted in HPI or Assessment and Plan. ? ?Objective  ?Well appearing patient in no apparent distress; mood and affect are within normal limits. ? ?A focused examination was performed including abdomen. Relevant physical exam findings are noted in the Assessment and Plan. ? ?umbilicus ?Firm pink/brown dermal papule(s)/plaque(s).  ? ? ?Assessment & Plan  ?Keloid ?symptomatic ?umbilicus ?Intralesional steroid injection side effects were reviewed including thinning of the skin and discoloration, such as redness, lightening or darkening. ? ?[place] [id number] ? ?Intralesional injection - umbilicus ?Location: left side ? ?Informed Consent: Discussed risks (infection, pain, bleeding, bruising, thinning of the skin, loss of skin pigment, lack of resolution, and recurrence of lesion) and benefits of the procedure, as well as the alternatives. Informed consent was obtained. ?Preparation: The area was prepared a standard fashion. ? ?Procedure Details: An intralesional injection was performed with Kenalog 5 mg/cc. 3 cc in total were injected. ? ?Total number of injections: 12 ? ?Plan: The patient was instructed on post-op care. Recommend OTC analgesia as needed for pain. ? ?Return in about 6 weeks (around 03/26/2022) for [place] for keloid. ? ?Graciella Belton, RMA, am acting as scribe for Sarina Ser, MD . ?Documentation: I have reviewed the above documentation for accuracy and completeness, and I agree with the above. ? ?Sarina Ser, MD ? ?

## 2022-02-12 NOTE — Patient Instructions (Signed)

## 2022-02-17 ENCOUNTER — Ambulatory Visit: Payer: 59 | Admitting: Dermatology

## 2022-02-18 ENCOUNTER — Encounter: Payer: Self-pay | Admitting: Dermatology

## 2022-03-26 ENCOUNTER — Ambulatory Visit: Payer: 59 | Admitting: Dermatology

## 2022-03-26 DIAGNOSIS — D485 Neoplasm of uncertain behavior of skin: Secondary | ICD-10-CM

## 2022-03-26 DIAGNOSIS — L910 Hypertrophic scar: Secondary | ICD-10-CM | POA: Diagnosis not present

## 2022-03-26 NOTE — Progress Notes (Addendum)
? ?  Follow-Up Visit ?  ?Subjective  ?Barbara Baker is a 41 y.o. female who presents for the following: Follow-up (Keloid of umbilicus treated with IL injection. She also has a growth that she feels is also another keloid on her left lat breast that she would like treated). ? ?The following portions of the chart were reviewed this encounter and updated as appropriate:  ? Tobacco  Allergies  Meds  Problems  Med Hx  Surg Hx  Fam Hx   ?  ?Review of Systems:  No other skin or systemic complaints except as noted in HPI or Assessment and Plan. ? ?Objective  ?Well appearing patient in no apparent distress; mood and affect are within normal limits. ? ?A focused examination was performed including left breast, abdomen. Relevant physical exam findings are noted in the Assessment and Plan. ? ?Left lat breast, Umbilicus ?Firm pink/brown dermal papule(s)/plaque(s) - left lat breast measures 2.5 x 2.0 cm ? ? ? ? ? ?Assessment & Plan  ?Keloid (2) ?Left lat breast; Umbilicus ? ?Intralesional injection - Left lat breast, Umbilicus ?Location: Umbilicus, left lat breast ? ?Informed Consent: Discussed risks (infection, pain, bleeding, bruising, thinning of the skin, loss of skin pigment, lack of resolution, and recurrence of lesion) and benefits of the procedure, as well as the alternatives. Informed consent was obtained. ?Preparation: The area was prepared a standard fashion. ? ?Anesthesia: none ? ?Procedure Details: An intralesional injection was performed with Kenalog 5 mg/cc. 4.5 cc in total were injected. ?[place] # [id number]  ?Total number of injections: 12 injections to umbilicus (3 cc total), 5 injections to left lat breast (1.5 cc total) ? ?Plan: The patient was instructed on post-op care. Recommend OTC analgesia as needed for pain. ? ?Return in about 2 months (around 05/26/2022) for keloid. ? ?I, Ashok Cordia, CMA, am acting as scribe for Sarina Ser, MD . ?Documentation: I have reviewed the above documentation for  accuracy and completeness, and I agree with the above. ? ?Sarina Ser, MD ? ?

## 2022-03-26 NOTE — Patient Instructions (Signed)

## 2022-04-02 ENCOUNTER — Encounter: Payer: Self-pay | Admitting: Dermatology

## 2022-04-03 DIAGNOSIS — J309 Allergic rhinitis, unspecified: Secondary | ICD-10-CM

## 2022-04-03 HISTORY — DX: Allergic rhinitis, unspecified: J30.9

## 2022-04-09 ENCOUNTER — Other Ambulatory Visit: Payer: Self-pay | Admitting: Infectious Diseases

## 2022-04-09 DIAGNOSIS — Z1231 Encounter for screening mammogram for malignant neoplasm of breast: Secondary | ICD-10-CM

## 2022-04-10 ENCOUNTER — Ambulatory Visit
Admission: RE | Admit: 2022-04-10 | Discharge: 2022-04-10 | Disposition: A | Discharge status: Home / Self Care | Payer: 59 | Source: Ambulatory Visit | Attending: Infectious Diseases | Admitting: Infectious Diseases

## 2022-04-10 DIAGNOSIS — Z1231 Encounter for screening mammogram for malignant neoplasm of breast: Secondary | ICD-10-CM | POA: Diagnosis present

## 2022-04-14 ENCOUNTER — Other Ambulatory Visit: Payer: Self-pay | Admitting: Infectious Diseases

## 2022-04-14 DIAGNOSIS — N6489 Other specified disorders of breast: Secondary | ICD-10-CM

## 2022-04-14 DIAGNOSIS — R928 Other abnormal and inconclusive findings on diagnostic imaging of breast: Secondary | ICD-10-CM

## 2022-04-16 ENCOUNTER — Ambulatory Visit
Admission: RE | Admit: 2022-04-16 | Discharge: 2022-04-16 | Disposition: A | Discharge status: Home / Self Care | Payer: 59 | Source: Ambulatory Visit | Attending: Infectious Diseases | Admitting: Infectious Diseases

## 2022-04-16 DIAGNOSIS — R928 Other abnormal and inconclusive findings on diagnostic imaging of breast: Secondary | ICD-10-CM

## 2022-04-16 DIAGNOSIS — N6489 Other specified disorders of breast: Secondary | ICD-10-CM | POA: Insufficient documentation

## 2022-04-17 ENCOUNTER — Other Ambulatory Visit: Payer: Self-pay | Admitting: Infectious Diseases

## 2022-04-17 DIAGNOSIS — R928 Other abnormal and inconclusive findings on diagnostic imaging of breast: Secondary | ICD-10-CM

## 2022-04-17 DIAGNOSIS — N6489 Other specified disorders of breast: Secondary | ICD-10-CM

## 2022-04-30 ENCOUNTER — Ambulatory Visit
Admission: RE | Admit: 2022-04-30 | Discharge: 2022-04-30 | Disposition: A | Discharge status: Home / Self Care | Payer: 59 | Source: Ambulatory Visit | Attending: Infectious Diseases | Admitting: Infectious Diseases

## 2022-04-30 DIAGNOSIS — R928 Other abnormal and inconclusive findings on diagnostic imaging of breast: Secondary | ICD-10-CM | POA: Diagnosis not present

## 2022-04-30 DIAGNOSIS — N6489 Other specified disorders of breast: Secondary | ICD-10-CM | POA: Diagnosis not present

## 2022-04-30 DIAGNOSIS — N6042 Mammary duct ectasia of left breast: Secondary | ICD-10-CM | POA: Insufficient documentation

## 2022-04-30 HISTORY — PX: BREAST BIOPSY: SHX20

## 2022-05-01 LAB — SURGICAL PATHOLOGY

## 2022-06-11 NOTE — H&P (Signed)
Subjective:     Patient ID: Barbara Baker is a 41 y.o. female.   HPI   Returns for follow up discussio breast reduction. Current 4 0D-F cup. Reports several year history neck and upper back and shoulder pain. Denies numbness hands. Reports pain has persisted despite over 3 month trial specialty fitted bras, OTC pain medication, muscle relaxer, wight loss as below, regular exercise and activity. Notes associated HA from pulling on neck which is prohibitive for her work in which she sits all day. Reports difficulty walking as pain exacerbated from weight and has to hold breasts. Reports rashes or excoriation beneath breasts weekly. This has persisted despite hygiene measures and use vaseline, powders.    Wt down 20 lb since Jan 2023 through diet, exercise, stable since time of May 2023 consult. Goal 170 lb.   Mother with ovarian ca age 28.    MMG 03/2022 with possible bilateral breast asymmetries. On diagnostic MMG right breast concern was glandular tissue, left asymmetry persisted. Recommended 6 mo follow up imaging but as patient scheduled for reduction underwent biopsy left UIQ which was benign.   PMH significant for preDM, HbA1c 5.8 01/2022. She also follows with [facility] for treatment keloids and has received steroid injections to umbilicus and left breast. States umbilical lesion started after removal infected piercing, left breast lesion started after picking at mole. Reports she has considered reduction for several years but concerned about keloids. However feels pain back outweighs concern for keloids at this time.   Works from home for [employer]. Lives with teenage son. Mother in area to assist with post op care.   Review of Systems  Musculoskeletal: Positive for back pain and neck pain.  Psychiatric/Behavioral: Positive for sleep disturbance.    Remainder 12 point review negative    Objective:   Physical Exam Cardiovascular:     Rate and Rhythm: Normal rate and regular  rhythm.     Heart sounds: Normal heart sounds.  Pulmonary:     Effort: Pulmonary effort is normal.     Breath sounds: Normal breath sounds.  Lymphadenopathy:     Upper Body:     Right upper body: No axillary adenopathy.     Left upper body: No axillary adenopathy.  Skin:    Comments: Fitzpatrick 6     +shoulder grooving Breasts: grade 3 ptosis bilateral no masses Right> left volume SN to nipple R 37 l  35 cm  BW R 24 L 24 cm Nipple to IMF R 15 L 14 cm   MS: several cm keloid superior to umbilicus , additional keloid left lateral breast    Assessment:     Macromastia Chronic neck and back pain Intertrigo    Plan:     Chronic neck and back and shoulder pain, intertrigo with functional impairment, in setting of macromastia, that has failed conservative management including weight loss program. The pain is not related to other diagnoses.There is a reasonable likelihood that the patient's symptoms are primarily due to macromastia. Breast reduction is likely to result in improvement of the chronic pain and functional impairment.   Reviewed reduction with anchor type scars, OP surgery, drains, post operative visits and limitations, recovery. Diminished sensation nipple and breast skin, risk of nipple loss, wound healing problems, asymmetry, incidental carcinoma, changes with wt gain/loss, aging, unacceptable cosmetic appearance reviewed. Counseled cannot assure cup size. Reviewed effect of pregnancy on breasts and effect of surgery on ability to breast feed.   Reviewed the biopsy clip may  or may not be removed with resection. If not visualized during surgery or within pathology cuts then she may not know if clip still in place until next MMG.   She has been watching videos surgeries on line. We discussed free nipple graft is not planned for her but is a risk of this surgery and surgeon may elect to do this if any NAC compromise noted intra op. In this setting NAC would be permanently  asensate and would not stimulate.   Reviewed risk that she develops keloids breasts given her history.    Additional risks including but not limited to bleeding seroma hematoma damage to adjacent structures need for additional procedures blood clots in legs or lungs infection reviewed.   Anticipate >350 g reduction from each breast.    Drain teaching completed. Rx for tramadol given. Notes nausea with some type prescription pain medication in past.

## 2022-06-12 ENCOUNTER — Ambulatory Visit: Payer: 59 | Admitting: Dermatology

## 2022-06-12 ENCOUNTER — Encounter: Payer: 59 | Admitting: Dermatology

## 2022-06-12 NOTE — Progress Notes (Deleted)
   Follow-Up Visit   Subjective  Barbara Baker is a 41 y.o. female who presents for the following: No chief complaint on file..    The following portions of the chart were reviewed this encounter and updated as appropriate:      Review of Systems: No other skin or systemic complaints except as noted in HPI or Assessment and Plan.   Objective  Well appearing patient in no apparent distress; mood and affect are within normal limits.  {MOLM:[id number]::"Q full examination was performed including scalp, head, eyes, ears, nose, lips, neck, chest, axillae, abdomen, back, buttocks, bilateral upper extremities, bilateral lower extremities, hands, feet, fingers, toes, fingernails, and toenails. All findings within normal limits unless otherwise noted below."}   Assessment & Plan   No follow-ups on file. IRuthell Rummage, CMA, am acting as scribe for Sarina Ser, MD.

## 2022-06-16 ENCOUNTER — Other Ambulatory Visit: Payer: Self-pay

## 2022-06-16 ENCOUNTER — Encounter (HOSPITAL_BASED_OUTPATIENT_CLINIC_OR_DEPARTMENT_OTHER): Payer: Self-pay | Admitting: Plastic Surgery

## 2022-06-17 NOTE — Progress Notes (Signed)

## 2022-06-20 ENCOUNTER — Other Ambulatory Visit: Payer: Self-pay

## 2022-06-20 ENCOUNTER — Encounter (HOSPITAL_BASED_OUTPATIENT_CLINIC_OR_DEPARTMENT_OTHER)
Admission: RE | Disposition: A | Discharge status: Home / Self Care | Payer: Self-pay | Source: Home / Self Care | Attending: Plastic Surgery

## 2022-06-20 ENCOUNTER — Ambulatory Visit (HOSPITAL_BASED_OUTPATIENT_CLINIC_OR_DEPARTMENT_OTHER)
Admission: RE | Admit: 2022-06-20 | Discharge: 2022-06-20 | Disposition: A | Discharge status: Home / Self Care | Payer: 59 | Attending: Plastic Surgery | Admitting: Plastic Surgery

## 2022-06-20 ENCOUNTER — Encounter (HOSPITAL_BASED_OUTPATIENT_CLINIC_OR_DEPARTMENT_OTHER): Payer: Self-pay | Admitting: Plastic Surgery

## 2022-06-20 ENCOUNTER — Ambulatory Visit (HOSPITAL_BASED_OUTPATIENT_CLINIC_OR_DEPARTMENT_OTHER): Payer: 59 | Admitting: Anesthesiology

## 2022-06-20 DIAGNOSIS — Z8041 Family history of malignant neoplasm of ovary: Secondary | ICD-10-CM | POA: Diagnosis not present

## 2022-06-20 DIAGNOSIS — N62 Hypertrophy of breast: Secondary | ICD-10-CM | POA: Diagnosis present

## 2022-06-20 DIAGNOSIS — L304 Erythema intertrigo: Secondary | ICD-10-CM | POA: Insufficient documentation

## 2022-06-20 DIAGNOSIS — G8929 Other chronic pain: Secondary | ICD-10-CM | POA: Insufficient documentation

## 2022-06-20 DIAGNOSIS — N6489 Other specified disorders of breast: Secondary | ICD-10-CM | POA: Diagnosis not present

## 2022-06-20 DIAGNOSIS — Z01818 Encounter for other preprocedural examination: Secondary | ICD-10-CM

## 2022-06-20 HISTORY — PX: BREAST REDUCTION SURGERY: SHX8

## 2022-06-20 LAB — POCT PREGNANCY, URINE: Preg Test, Ur: NEGATIVE

## 2022-06-20 SURGERY — MAMMOPLASTY, REDUCTION
Anesthesia: General | Site: Breast | Laterality: Bilateral

## 2022-06-20 MED ORDER — CHLORHEXIDINE GLUCONATE CLOTH 2 % EX PADS: 6.0000 | MEDICATED_PAD | Freq: Once | CUTANEOUS | Status: DC

## 2022-06-20 MED ORDER — SCOPOLAMINE 1 MG/3DAYS TD PT72
1.0000 | MEDICATED_PATCH | TRANSDERMAL | Status: DC
Start: 2022-06-20 — End: 2022-06-20

## 2022-06-20 MED ORDER — FENTANYL CITRATE (PF) 100 MCG/2ML IJ SOLN: 25.0000 ug | INTRAMUSCULAR | Status: DC | PRN

## 2022-06-20 MED ORDER — ONDANSETRON HCL 4 MG/2ML IJ SOLN
INTRAMUSCULAR | Status: AC
  Filled 2022-06-20: qty 2

## 2022-06-20 MED ORDER — ATROPINE SULFATE 0.4 MG/ML IV SOLN
INTRAVENOUS | Status: AC
  Filled 2022-06-20: qty 1

## 2022-06-20 MED ORDER — 0.9 % SODIUM CHLORIDE (POUR BTL) OPTIME
TOPICAL | Status: DC | PRN
  Administered 2022-06-20: 500 mL

## 2022-06-20 MED ORDER — ACETAMINOPHEN 160 MG/5ML PO SOLN: 325.0000 mg | ORAL | Status: DC | PRN

## 2022-06-20 MED ORDER — DEXAMETHASONE SODIUM PHOSPHATE 10 MG/ML IJ SOLN
INTRAMUSCULAR | Status: AC
  Filled 2022-06-20: qty 1

## 2022-06-20 MED ORDER — PHENYLEPHRINE 80 MCG/ML (10ML) SYRINGE FOR IV PUSH (FOR BLOOD PRESSURE SUPPORT)
PREFILLED_SYRINGE | INTRAVENOUS | Status: AC
  Filled 2022-06-20: qty 10

## 2022-06-20 MED ORDER — ONDANSETRON HCL 4 MG/2ML IJ SOLN
4.0000 mg | Freq: Once | INTRAMUSCULAR | Status: AC | PRN
Start: 2022-06-20 — End: 2022-06-20
  Administered 2022-06-20: 4 mg via INTRAVENOUS

## 2022-06-20 MED ORDER — CELECOXIB 200 MG PO CAPS
200.0000 mg | ORAL_CAPSULE | ORAL | Status: AC
  Administered 2022-06-20: 200 mg via ORAL

## 2022-06-20 MED ORDER — GABAPENTIN 300 MG PO CAPS
ORAL_CAPSULE | ORAL | Status: AC
  Filled 2022-06-20: qty 1

## 2022-06-20 MED ORDER — EPHEDRINE 5 MG/ML INJ
INTRAVENOUS | Status: AC
  Filled 2022-06-20: qty 5

## 2022-06-20 MED ORDER — PHENYLEPHRINE HCL (PRESSORS) 10 MG/ML IV SOLN
INTRAVENOUS | Status: DC | PRN
  Administered 2022-06-20: 80 ug via INTRAVENOUS

## 2022-06-20 MED ORDER — SUCCINYLCHOLINE CHLORIDE 200 MG/10ML IV SOSY
PREFILLED_SYRINGE | INTRAVENOUS | Status: AC
  Filled 2022-06-20: qty 10

## 2022-06-20 MED ORDER — ONDANSETRON HCL 4 MG/2ML IJ SOLN
INTRAMUSCULAR | Status: DC | PRN
  Administered 2022-06-20: 4 mg via INTRAVENOUS

## 2022-06-20 MED ORDER — PROPOFOL 10 MG/ML IV BOLUS
INTRAVENOUS | Status: DC | PRN
  Administered 2022-06-20: 150 mg via INTRAVENOUS

## 2022-06-20 MED ORDER — ACETAMINOPHEN 500 MG PO TABS
1000.0000 mg | ORAL_TABLET | ORAL | Status: AC
  Administered 2022-06-20: 1000 mg via ORAL

## 2022-06-20 MED ORDER — OXYCODONE HCL 5 MG/5ML PO SOLN: 5.0000 mg | Freq: Once | ORAL | Status: AC | PRN

## 2022-06-20 MED ORDER — SUGAMMADEX SODIUM 200 MG/2ML IV SOLN
INTRAVENOUS | Status: DC | PRN
  Administered 2022-06-20: 200 mg via INTRAVENOUS

## 2022-06-20 MED ORDER — ROCURONIUM BROMIDE 100 MG/10ML IV SOLN
INTRAVENOUS | Status: DC | PRN
  Administered 2022-06-20: 50 mg via INTRAVENOUS
  Administered 2022-06-20: 10 mg via INTRAVENOUS

## 2022-06-20 MED ORDER — LIDOCAINE 2% (20 MG/ML) 5 ML SYRINGE
INTRAMUSCULAR | Status: AC
  Filled 2022-06-20: qty 5

## 2022-06-20 MED ORDER — BUPIVACAINE HCL (PF) 0.5 % IJ SOLN
INTRAMUSCULAR | Status: DC | PRN
  Administered 2022-06-20: 15 mL

## 2022-06-20 MED ORDER — FENTANYL CITRATE (PF) 100 MCG/2ML IJ SOLN
INTRAMUSCULAR | Status: DC | PRN
  Administered 2022-06-20: 100 ug via INTRAVENOUS

## 2022-06-20 MED ORDER — OXYCODONE HCL 5 MG PO TABS
ORAL_TABLET | ORAL | Status: AC
  Filled 2022-06-20: qty 1

## 2022-06-20 MED ORDER — MEPERIDINE HCL 25 MG/ML IJ SOLN: 6.2500 mg | INTRAMUSCULAR | Status: DC | PRN

## 2022-06-20 MED ORDER — CEFAZOLIN SODIUM-DEXTROSE 2-4 GM/100ML-% IV SOLN
INTRAVENOUS | Status: AC
  Filled 2022-06-20: qty 100

## 2022-06-20 MED ORDER — MIDAZOLAM HCL 5 MG/5ML IJ SOLN
INTRAMUSCULAR | Status: DC | PRN
  Administered 2022-06-20: 2 mg via INTRAVENOUS

## 2022-06-20 MED ORDER — GABAPENTIN 300 MG PO CAPS
300.0000 mg | ORAL_CAPSULE | ORAL | Status: AC
  Administered 2022-06-20: 300 mg via ORAL

## 2022-06-20 MED ORDER — LIDOCAINE HCL (CARDIAC) PF 100 MG/5ML IV SOSY
PREFILLED_SYRINGE | INTRAVENOUS | Status: DC | PRN
  Administered 2022-06-20: 40 mg via INTRAVENOUS

## 2022-06-20 MED ORDER — ROCURONIUM BROMIDE 10 MG/ML (PF) SYRINGE
PREFILLED_SYRINGE | INTRAVENOUS | Status: AC
  Filled 2022-06-20: qty 10

## 2022-06-20 MED ORDER — FENTANYL CITRATE (PF) 100 MCG/2ML IJ SOLN
INTRAMUSCULAR | Status: AC
  Filled 2022-06-20: qty 2

## 2022-06-20 MED ORDER — ACETAMINOPHEN 500 MG PO TABS
ORAL_TABLET | ORAL | Status: AC
  Filled 2022-06-20: qty 2

## 2022-06-20 MED ORDER — HYDROMORPHONE HCL 1 MG/ML IJ SOLN
INTRAMUSCULAR | Status: DC | PRN
  Administered 2022-06-20 (×2): .5 mg via INTRAVENOUS

## 2022-06-20 MED ORDER — ACETAMINOPHEN 325 MG PO TABS: 325.0000 mg | ORAL_TABLET | ORAL | Status: DC | PRN

## 2022-06-20 MED ORDER — HYDROMORPHONE HCL 1 MG/ML IJ SOLN
INTRAMUSCULAR | Status: AC
  Filled 2022-06-20: qty 1

## 2022-06-20 MED ORDER — DEXAMETHASONE SODIUM PHOSPHATE 4 MG/ML IJ SOLN
INTRAMUSCULAR | Status: DC | PRN
  Administered 2022-06-20: 10 mg via INTRAVENOUS

## 2022-06-20 MED ORDER — OXYCODONE HCL 5 MG PO TABS
5.0000 mg | ORAL_TABLET | Freq: Once | ORAL | Status: AC | PRN
  Administered 2022-06-20: 5 mg via ORAL

## 2022-06-20 MED ORDER — MIDAZOLAM HCL 2 MG/2ML IJ SOLN
INTRAMUSCULAR | Status: AC
  Filled 2022-06-20: qty 2

## 2022-06-20 MED ORDER — CELECOXIB 200 MG PO CAPS
ORAL_CAPSULE | ORAL | Status: AC
  Filled 2022-06-20: qty 1

## 2022-06-20 MED ORDER — CEFAZOLIN SODIUM-DEXTROSE 2-4 GM/100ML-% IV SOLN
2.0000 g | INTRAVENOUS | Status: AC
  Administered 2022-06-20: 2 g via INTRAVENOUS

## 2022-06-20 MED ORDER — LACTATED RINGERS IV SOLN: INTRAVENOUS | Status: DC

## 2022-06-20 SURGICAL SUPPLY — 53 items
ADH SKN CLS APL DERMABOND .7 (GAUZE/BANDAGES/DRESSINGS) ×2
APL PRP STRL LF DISP 70% ISPRP (MISCELLANEOUS) ×2
BINDER BREAST 3XL (GAUZE/BANDAGES/DRESSINGS) IMPLANT
BINDER BREAST LRG (GAUZE/BANDAGES/DRESSINGS) IMPLANT
BINDER BREAST MEDIUM (GAUZE/BANDAGES/DRESSINGS) IMPLANT
BINDER BREAST XLRG (GAUZE/BANDAGES/DRESSINGS) IMPLANT
BINDER BREAST XXLRG (GAUZE/BANDAGES/DRESSINGS) IMPLANT
BLADE SURG 10 STRL SS (BLADE) ×8 IMPLANT
BNDG GAUZE DERMACEA FLUFF (GAUZE/BANDAGES/DRESSINGS) ×2
BNDG GAUZE DERMACEA FLUFF 4 (GAUZE/BANDAGES/DRESSINGS) ×2 IMPLANT
BNDG GZE DERMACEA 4 6PLY (GAUZE/BANDAGES/DRESSINGS) ×2
CANISTER SUCT 1200ML W/VALVE (MISCELLANEOUS) ×2 IMPLANT
CHLORAPREP W/TINT 26 (MISCELLANEOUS) ×4 IMPLANT
COVER BACK TABLE 60X90IN (DRAPES) ×2 IMPLANT
COVER MAYO STAND STRL (DRAPES) ×2 IMPLANT
DERMABOND ADVANCED (GAUZE/BANDAGES/DRESSINGS) ×2
DERMABOND ADVANCED .7 DNX12 (GAUZE/BANDAGES/DRESSINGS) ×2 IMPLANT
DRAIN CHANNEL 15F RND FF W/TCR (WOUND CARE) IMPLANT
DRAPE TOP ARMCOVERS (MISCELLANEOUS) ×2 IMPLANT
DRAPE U-SHAPE 76X120 STRL (DRAPES) ×2 IMPLANT
DRAPE UTILITY XL STRL (DRAPES) ×2 IMPLANT
DRSG PAD ABDOMINAL 8X10 ST (GAUZE/BANDAGES/DRESSINGS) ×4 IMPLANT
ELECT COATED BLADE 2.86 ST (ELECTRODE) ×2 IMPLANT
ELECT REM PT RETURN 9FT ADLT (ELECTROSURGICAL) ×2
ELECTRODE REM PT RTRN 9FT ADLT (ELECTROSURGICAL) ×1 IMPLANT
EVACUATOR SILICONE 100CC (DRAIN) IMPLANT
GLOVE BIO SURGEON STRL SZ 6 (GLOVE) ×4 IMPLANT
GOWN STRL REUS W/ TWL LRG LVL3 (GOWN DISPOSABLE) ×2 IMPLANT
GOWN STRL REUS W/TWL LRG LVL3 (GOWN DISPOSABLE) ×4
MARKER SKIN DUAL TIP RULER LAB (MISCELLANEOUS) IMPLANT
NDL HYPO 25X1 1.5 SAFETY (NEEDLE) IMPLANT
NEEDLE HYPO 25X1 1.5 SAFETY (NEEDLE) IMPLANT
NS IRRIG 1000ML POUR BTL (IV SOLUTION) ×2 IMPLANT
PACK BASIN DAY SURGERY FS (CUSTOM PROCEDURE TRAY) ×2 IMPLANT
PENCIL SMOKE EVACUATOR (MISCELLANEOUS) ×2 IMPLANT
PIN SAFETY STERILE (MISCELLANEOUS) ×2 IMPLANT
SHEET MEDIUM DRAPE 40X70 STRL (DRAPES) ×4 IMPLANT
SLEEVE SCD COMPRESS KNEE MED (STOCKING) ×2 IMPLANT
SPONGE T-LAP 18X18 ~~LOC~~+RFID (SPONGE) ×8 IMPLANT
STAPLER VISISTAT 35W (STAPLE) ×2 IMPLANT
SUT ETHILON 2 0 FS 18 (SUTURE) ×2 IMPLANT
SUT MNCRL AB 4-0 PS2 18 (SUTURE) ×4 IMPLANT
SUT VIC AB 3-0 PS1 18 (SUTURE) ×12
SUT VIC AB 3-0 PS1 18XBRD (SUTURE) ×6 IMPLANT
SUT VICRYL 4-0 PS2 18IN ABS (SUTURE) ×4 IMPLANT
SUT VLOC 180 P-14 24 (SUTURE) ×4 IMPLANT
SYR BULB IRRIG 60ML STRL (SYRINGE) ×2 IMPLANT
SYR CONTROL 10ML LL (SYRINGE) IMPLANT
TAPE MEASURE VINYL STERILE (MISCELLANEOUS) IMPLANT
TOWEL GREEN STERILE FF (TOWEL DISPOSABLE) ×4 IMPLANT
TUBE CONNECTING 20X1/4 (TUBING) ×2 IMPLANT
UNDERPAD 30X36 HEAVY ABSORB (UNDERPADS AND DIAPERS) ×4 IMPLANT
YANKAUER SUCT BULB TIP NO VENT (SUCTIONS) ×2 IMPLANT

## 2022-06-20 NOTE — Anesthesia Postprocedure Evaluation (Signed)
Anesthesia Post Note  Patient: Barbara Baker  Procedure(s) Performed: MAMMARY REDUCTION  (BREAST) (Bilateral: Breast)     Patient location during evaluation: PACU Anesthesia Type: General Level of consciousness: awake and alert Pain management: pain level controlled Vital Signs Assessment: post-procedure vital signs reviewed and stable Respiratory status: spontaneous breathing, nonlabored ventilation, respiratory function stable and patient connected to nasal cannula oxygen Cardiovascular status: blood pressure returned to baseline and stable Postop Assessment: no apparent nausea or vomiting Anesthetic complications: no   No notable events documented.  Last Vitals:  Vitals:   06/20/22 1549 06/20/22 1600  BP: 136/77 123/65  Pulse: 99 (!) 102  Resp: 18 13  Temp:    SpO2: 94% 98%    Last Pain:  Vitals:   06/20/22 1600  TempSrc:   PainSc: 3                  [name]

## 2022-06-20 NOTE — Anesthesia Preprocedure Evaluation (Addendum)
Anesthesia Evaluation  Patient identified by MRN, date of birth, ID band Patient awake    Reviewed: Allergy & Precautions, NPO status , Patient's Chart, lab work & pertinent test results  Airway Mallampati: I  TM Distance: >3 FB Neck ROM: Full    Dental  (+) Teeth Intact, Dental Advisory Given   Pulmonary neg pulmonary ROS,    breath sounds clear to auscultation       Cardiovascular negative cardio ROS   Rhythm:Regular Rate:Normal     Neuro/Psych negative neurological ROS  negative psych ROS   GI/Hepatic negative GI ROS, Neg liver ROS,   Endo/Other  negative endocrine ROS  Renal/GU negative Renal ROS     Musculoskeletal negative musculoskeletal ROS (+)   Abdominal Normal abdominal exam  (+)   Peds  Hematology negative hematology ROS (+)   Anesthesia Other Findings   Reproductive/Obstetrics                            Anesthesia Physical Anesthesia Plan  ASA: 2  Anesthesia Plan: General   Post-op Pain Management:    Induction: Intravenous  PONV Risk Score and Plan: 4 or greater and Ondansetron, Dexamethasone, Midazolam and Scopolamine patch - Pre-op  Airway Management Planned: Oral ETT  Additional Equipment: None  Intra-op Plan:   Post-operative Plan: Extubation in OR  Informed Consent: I have reviewed the patients History and Physical, chart, labs and discussed the procedure including the risks, benefits and alternatives for the proposed anesthesia with the patient or authorized representative who has indicated his/her understanding and acceptance.     Dental advisory given  Plan Discussed with: CRNA  Anesthesia Plan Comments:        Anesthesia Quick Evaluation

## 2022-06-20 NOTE — Interval H&P Note (Signed)
History and Physical Interval Note:  06/20/2022 10:26 AM  Barbara Baker  has presented today for surgery, with the diagnosis of macromastia, chronic neck and back pain, shoulder pain, intertrigo.  The various methods of treatment have been discussed with the patient and family. After consideration of risks, benefits and other options for treatment, the patient has consented to  Procedure(s): MAMMARY REDUCTION  (BREAST) (Bilateral) as a surgical intervention.  The patient's history has been reviewed, patient examined, no change in status, stable for surgery.  I have reviewed the patient's chart and labs.  Questions were answered to the patient's satisfaction.     Arnoldo Hooker Masako Overall

## 2022-06-20 NOTE — Anesthesia Procedure Notes (Signed)
Procedure Name: Intubation Date/Time: 06/20/2022 10:57 AM  Performed by: Willa Frater, CRNAPre-anesthesia Checklist: Patient identified, Emergency Drugs available, Suction available and Patient being monitored Patient Re-evaluated:Patient Re-evaluated prior to induction Oxygen Delivery Method: Circle system utilized Preoxygenation: Pre-oxygenation with 100% oxygen Induction Type: IV induction Ventilation: Mask ventilation without difficulty Laryngoscope Size: Mac and 3 Grade View: Grade I Tube type: Oral Number of attempts: 1 Airway Equipment and Method: Stylet and Oral airway Placement Confirmation: ETT inserted through vocal cords under direct vision, positive ETCO2 and breath sounds checked- equal and bilateral Secured at: 22 cm Tube secured with: Tape Dental Injury: Teeth and Oropharynx as per pre-operative assessment

## 2022-06-20 NOTE — Discharge Instructions (Addendum)
No Tylenol before 4pm.  No Ibuprofen/Motrin before 6pm.   Post Anesthesia Home Care Instructions  Activity: Get plenty of rest for the remainder of the day. A responsible individual must stay with you for 24 hours following the procedure.  For the next 24 hours, DO NOT: -Drive a car -Paediatric nurse -Drink alcoholic beverages -Take any medication unless instructed by your physician -Make any legal decisions or sign important papers.  Meals: Start with liquid foods such as gelatin or soup. Progress to regular foods as tolerated. Avoid greasy, spicy, heavy foods. If nausea and/or vomiting occur, drink only clear liquids until the nausea and/or vomiting subsides. Call your physician if vomiting continues.  Special Instructions/Symptoms: Your throat may feel dry or sore from the anesthesia or the breathing tube placed in your throat during surgery. If this causes discomfort, gargle with warm salt water. The discomfort should disappear within 24 hours.  If you had a scopolamine patch placed behind your ear for the management of post- operative nausea and/or vomiting:  1. The medication in the patch is effective for 72 hours, after which it should be removed.  Wrap patch in a tissue and discard in the trash. Wash hands thoroughly with soap and water. 2. You may remove the patch earlier than 72 hours if you experience unpleasant side effects which may include dry mouth, dizziness or visual disturbances. 3. Avoid touching the patch. Wash your hands with soap and water after contact with the patch.        JP Drain [employer] this sheet to all of your post-operative appointments while you have your drains. Please measure your drains by CC's or ML's. Make sure you drain and measure your JP Drains 2 or 3 times per day. At the end of each day, add up totals for the left side and add up totals for the right side.    ( 9 am )     ( 3 pm )        ( 9 pm )                Date L  R  L  R  L  R   Total L/R

## 2022-06-20 NOTE — Op Note (Signed)
Operative Note   DATE OF OPERATION: 7.21.23  LOCATION: [facility]-outpatient  SURGICAL DIVISION: Plastic Surgery  PREOPERATIVE DIAGNOSES:  1. Macromastia 2. Chronic neck and back pain 3. Intertrigo  POSTOPERATIVE DIAGNOSES:  same  PROCEDURE:  Bilateral breast reduction  SURGEON: Irene Limbo MD MBA  ASSISTANT: none  ANESTHESIA:  General.   EBL: 35 ml  COMPLICATIONS: None immediate.   INDICATIONS FOR PROCEDURE:  The patient, Barbara Baker, is a 41 y.o. female born on 10/20/1981, is here for treatment chronic neck and back pain intertrigo in setting of macromastia that has failed conservative measures.   FINDINGS: right reduction 888 g left reduction 777 g  DESCRIPTION OF PROCEDURE:  The patient was marked standing in the preoperative area to mark sternal notch, chest midline, anterior axillary lines, inframammary folds. The location of new nipple areolar complex was marked at level of on inframammary fold on anterior surface breast by palpation. This was marked symmetric over bilateral breasts. With aid of Wise pattern marker, location of new nipple areolar complex and vertical limbs (7.5 cm) were marked by displacement of breasts along meridian. The patient was taken to the operating room. SCDs were placed and IV antibiotics were given. The patient's operative site was prepped and draped in a sterile fashion. A time out was performed and all information was confirmed to be correct.     Over left breast, superior medial pedicle marked and nipple areolar complex incised with 45 mm diameter marker. Pedicle deepithlialized and developed to chest wall. Breast tissue resected over lower pole. Medial and lateral flaps developed. Additional lateral and superior breast tissue excised. Breast tailor tacked closed.    I then directed attention to right breast where superior medial pedicle designed. NAC marked with 45 ml diameter marker and incised. The pedicle was deepithelialized.  Pedicle developed to chest wall. Breast tissue resected over lower pole. Medial and lateral flaps developed. Additional lateral and superior pole breast tissue excised. Breast tailor tacked closed. Patient brought to upright sitting position and assessed for symmetry. Patient returned to supine position. Breast cavities irrigated and hemostasis obtained. Local anesthetic infiltrated throughout each breast. 15 Fr JP placed in each breast and secured with 2-0 nylon. Closure completed bilateral with 3-0 vicryl to approximate dermis along inframammary fold and vertical limb. NAC inset with 4-0 vicryl in dermis. Skin closure completed with 4-0 monocryl subcuticular throughout. Tissue adhesive applied. Dry dressing and breast binder applied.  The patient was allowed to wake from anesthesia, extubated and taken to the recovery room in satisfactory condition.   SPECIMENS: right and left breast reduction  DRAINS: 15 Fr JP in right and left breast  Irene Limbo, MD [facility]astic [facility]  Office/ physician access line after hours [phone]

## 2022-06-20 NOTE — Transfer of Care (Signed)
Immediate Anesthesia Transfer of Care Note  Patient: Barbara Baker  Procedure(s) Performed: MAMMARY REDUCTION  (BREAST) (Bilateral: Breast)  Patient Location: PACU  Anesthesia Type:General  Level of Consciousness: awake, drowsy and patient cooperative  Airway & Oxygen Therapy: Patient Spontanous Breathing and Patient connected to face mask oxygen  Post-op Assessment: Report given to RN  Post vital signs: Reviewed and stable  Last Vitals:  Vitals Value Taken Time  BP    Temp    Pulse    Resp    SpO2      Last Pain:  Vitals:   06/20/22 1004  TempSrc: Oral  PainSc: 0-No pain      Patients Stated Pain Goal: 3 (16/83/72 9021)  Complications: No notable events documented.

## 2022-06-23 ENCOUNTER — Encounter (HOSPITAL_BASED_OUTPATIENT_CLINIC_OR_DEPARTMENT_OTHER): Payer: Self-pay | Admitting: Plastic Surgery

## 2022-06-23 LAB — SURGICAL PATHOLOGY

## 2022-06-23 NOTE — Addendum Note (Signed)
Addendum  created 06/23/22 1319 by Lavonia Dana, CRNA   Charge Capture section accepted

## 2022-07-16 ENCOUNTER — Other Ambulatory Visit: Payer: Self-pay

## 2022-07-16 DIAGNOSIS — L409 Psoriasis, unspecified: Secondary | ICD-10-CM

## 2022-07-16 MED ORDER — FLUOCINOLONE ACETONIDE BODY 0.01 % EX OIL: 1.0000 "application " | TOPICAL_OIL | Freq: Every day | CUTANEOUS | 2 refills | Status: DC

## 2022-07-16 NOTE — Progress Notes (Signed)
[facility] faxed refill request. Escripted

## 2022-07-17 ENCOUNTER — Ambulatory Visit: Payer: 59 | Admitting: Dermatology

## 2022-07-23 ENCOUNTER — Ambulatory Visit: Payer: 59 | Admitting: Dermatology

## 2022-12-03 ENCOUNTER — Ambulatory Visit: Payer: 59 | Admitting: Dermatology

## 2022-12-03 DIAGNOSIS — L408 Other psoriasis: Secondary | ICD-10-CM | POA: Diagnosis not present

## 2022-12-03 DIAGNOSIS — Z79899 Other long term (current) drug therapy: Secondary | ICD-10-CM

## 2022-12-03 DIAGNOSIS — L910 Hypertrophic scar: Secondary | ICD-10-CM | POA: Diagnosis not present

## 2022-12-03 DIAGNOSIS — L905 Scar conditions and fibrosis of skin: Secondary | ICD-10-CM | POA: Diagnosis not present

## 2022-12-03 DIAGNOSIS — L409 Psoriasis, unspecified: Secondary | ICD-10-CM

## 2022-12-03 MED ORDER — CICLOPIROX 1 % EX SHAM: 1.0000 "application " | MEDICATED_SHAMPOO | CUTANEOUS | 6 refills | Status: DC

## 2022-12-03 MED ORDER — HYDROCORTISONE 2.5 % EX CREA: TOPICAL_CREAM | Freq: Two times a day (BID) | CUTANEOUS | 11 refills | Status: DC | PRN

## 2022-12-03 MED ORDER — FLUOCINOLONE ACETONIDE BODY 0.01 % EX OIL: 1.0000 "application " | TOPICAL_OIL | Freq: Every day | CUTANEOUS | 2 refills | Status: DC

## 2022-12-03 MED ORDER — CLOBETASOL PROPIONATE 0.05 % EX FOAM: CUTANEOUS | 6 refills | Status: DC

## 2022-12-03 NOTE — Patient Instructions (Addendum)
Due to recent changes in healthcare laws, you may see results of your pathology and/or laboratory studies on MyChart before the doctors have had a chance to review them. We understand that in some cases there may be results that are confusing or concerning to you. Please understand that not all results are received at the same time and often the doctors may need to interpret multiple results in order to provide you with the best plan of care or course of treatment. Therefore, we ask that you please give us 2 business days to thoroughly review all your results before contacting the office for clarification. Should we see a critical lab result, you will be contacted sooner.   If You Need Anything After Your Visit  If you have any questions or concerns for your doctor, please call our main line at [phone] and press option 4 to reach your doctor's medical assistant. If no one answers, please leave a voicemail as directed and we will return your call as soon as possible. Messages left after 4 pm will be answered the following business day.   You may also send us a message via MyChart. We typically respond to MyChart messages within 1-2 business days.  For prescription refills, please ask your pharmacy to contact our office. Our fax number is [phone].  If you have an urgent issue when the clinic is closed that cannot wait until the next business day, you can page your doctor at the number below.    Please note that while we do our best to be available for urgent issues outside of office hours, we are not available 24/7.   If you have an urgent issue and are unable to reach us, you may choose to seek medical care at your doctor's office, retail clinic, urgent care center, or emergency room.  If you have a medical emergency, please immediately call 911 or go to the emergency department.  Pager Numbers  - Dr. Kowalski: [phone]  - Dr. Moye: [phone]  - Dr. Stewart:  [phone]  In the event of inclement weather, please call our main line at [phone] for an update on the status of any delays or closures.  Dermatology Medication Tips: Please keep the boxes that topical medications come in in order to help keep track of the instructions about where and how to use these. Pharmacies typically print the medication instructions only on the boxes and not directly on the medication tubes.   If your medication is too expensive, please contact our office at [phone] option 4 or send us a message through MyChart.   We are unable to tell what your co-pay for medications will be in advance as this is different depending on your insurance coverage. However, we may be able to find a substitute medication at lower cost or fill out paperwork to get insurance to cover a needed medication.   If a prior authorization is required to get your medication covered by your insurance company, please allow us 1-2 business days to complete this process.  Drug prices often vary depending on where the prescription is filled and some pharmacies may offer cheaper prices.  The website www.goodrx.com contains coupons for medications through different pharmacies. The prices here do not account for what the cost may be with help from insurance (it may be cheaper with your insurance), but the website can give you the price if you did not use any insurance.  - You can print the associated coupon and take it with   your prescription to the pharmacy.  - You may also stop by our office during regular business hours and pick up a GoodRx coupon card.  - If you need your prescription sent electronically to a different pharmacy, notify our office through [facility] MyChart or by phone at [phone] option 4.     Si Usted Necesita Algo Despus de Su Visita  Tambin puede enviarnos un mensaje a travs de MyChart. Por lo general respondemos a los mensajes de MyChart en el transcurso de 1 a 2  das hbiles.  Para renovar recetas, por favor pida a su farmacia que se ponga en contacto con nuestra oficina. Nuestro nmero de fax es el [phone].  Si tiene un asunto urgente cuando la clnica est cerrada y que no puede esperar hasta el siguiente da hbil, puede llamar/localizar a su doctor(a) al nmero que aparece a continuacin.   Por favor, tenga en cuenta que aunque hacemos todo lo posible para estar disponibles para asuntos urgentes fuera del horario de oficina, no estamos disponibles las 24 horas del da, los 7 das de la semana.   Si tiene un problema urgente y no puede comunicarse con nosotros, puede optar por buscar atencin mdica  en el consultorio de su doctor(a), en una clnica privada, en un centro de atencin urgente o en una sala de emergencias.  Si tiene una emergencia mdica, por favor llame inmediatamente al 911 o vaya a la sala de emergencias.  Nmeros de bper  - Dr. Kowalski: [phone]  - Dra. Moye: [phone]  - Dra. Stewart: [phone]  En caso de inclemencias del tiempo, por favor llame a nuestra lnea principal al [phone] para una actualizacin sobre el estado de cualquier retraso o cierre.  Consejos para la medicacin en dermatologa: Por favor, guarde las cajas en las que vienen los medicamentos de uso tpico para ayudarle a seguir las instrucciones sobre dnde y cmo usarlos. Las farmacias generalmente imprimen las instrucciones del medicamento slo en las cajas y no directamente en los tubos del medicamento.   Si su medicamento es muy caro, por favor, pngase en contacto con nuestra oficina llamando al [phone] y presione la opcin 4 o envenos un mensaje a travs de MyChart.   No podemos decirle cul ser su copago por los medicamentos por adelantado ya que esto es diferente dependiendo de la cobertura de su seguro. Sin embargo, es posible que podamos encontrar un medicamento sustituto a menor costo o llenar un formulario para que el  seguro cubra el medicamento que se considera necesario.   Si se requiere una autorizacin previa para que su compaa de seguros cubra su medicamento, por favor permtanos de 1 a 2 das hbiles para completar este proceso.  Los precios de los medicamentos varan con frecuencia dependiendo del lugar de dnde se surte la receta y alguna farmacias pueden ofrecer precios ms baratos.  El sitio web www.goodrx.com tiene cupones para medicamentos de diferentes farmacias. Los precios aqu no tienen en cuenta lo que podra costar con la ayuda del seguro (puede ser ms barato con su seguro), pero el sitio web puede darle el precio si no utiliz ningn seguro.  - Puede imprimir el cupn correspondiente y llevarlo con su receta a la farmacia.  - Tambin puede pasar por nuestra oficina durante el horario de atencin regular y recoger una tarjeta de cupones de GoodRx.  - Si necesita que su receta se enve electrnicamente a una farmacia diferente, informe a nuestra oficina a travs de MyChart de [place]   o por telfono llamando al [phone] y presione la opcin 4.  

## 2022-12-03 NOTE — Progress Notes (Signed)
Follow-Up Visit   Subjective  Barbara Baker is a 42 y.o. 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 She would like scars checked on her breast where she had breast reduction in July 2023.  She wonders if they need injected.  The following portions of the chart were reviewed this encounter and updated as appropriate:   Tobacco  Allergies  Meds  Problems  Med Hx  Surg Hx  Fam Hx     Review of Systems:  No other skin or systemic complaints except as noted in HPI or Assessment and Plan.  Objective  Well appearing patient in no apparent distress; mood and affect are within normal limits.  A focused examination was performed including face,scalp. Relevant physical exam findings are noted in the Assessment and Plan.  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 Condition is symptomatic / bothersome to patient. Not to goal.  ILK 10 mg injected   NDC 4315-4008-67 Lot [id number] Exp 08/31/2024  Intralesional injection - umbilicus Location: umbilicus   Informed Consent: Discussed risks (infection, pain, bleeding, bruising, thinning of the skin, loss of skin pigment, lack of resolution, and recurrence of lesion) and benefits of the procedure, as well as the alternatives. Informed consent was obtained. Preparation: The area was prepared a standard fashion.  Anesthesia: none   Procedure Details: An intralesional injection was performed with Kenalog 10 mg/cc. 1.0 cc in total were injected.  Total  number of injections: 9  Plan: The patient was instructed on post-op care. Recommend OTC analgesia as needed for pain.  Psoriasis - Sebo Psorasis  face,scalp Counseling on psoriasis and coordination of care  psoriasis is a chronic non-curable, but treatable genetic/hereditary disease that may have other systemic features affecting other organ systems such as joints (Psoriatic Arthritis). It is associated with an increased risk of inflammatory bowel disease, heart disease, non-alcoholic fatty liver disease, and depression.  Treatments include light and laser treatments; topical medications; and systemic medications including oral and injectables.   Chronic and persistent condition with duration or expected duration over one year. Condition is symptomatic/ bothersome to patient. Not currently at goal.   Start Hydrocortisone 2.5% cream apply to face once a day Mon, Wednesday and Friday  Continue Clobetasol Foam qd up to 5d/wk aa scalp prn persistent or severe spots, avoid f/g/a Patient using Every 2 - 3 weeks when itchy we would recommend she continue that regimine  Continue Fluocinolone oil qd up to 5d/wk aa scalp Patient is using every other day 3 - 4 days weekly , we would recommend she continues that regimine  Continue Ciclopirox shampoo trying to increase to weekly Patient is using once or twice a month. Would recommend increasing    hydrocortisone 2.5 % cream - face,scalp Apply topically 2 (two) times daily as needed (Rash). apply to affected areas on her face once a day Mon, Wednesday and Friday Related Medications Ciclopirox 1 % shampoo Apply 1 application  topically once a week. Apply to scalp let sit 5 minutes and rinse out clobetasol (OLUX) 0.05 % topical foam Apply  topically as directed. Qd aa psoriasis on scalp up to 5 day per week, avoid face, groin, axilla Fluocinolone Acetonide Body 0.01 % OIL Apply 1 application  topically daily. Qd to aa scalp prn  flares  Scar breast Scar from breast reduction July 2023  Observe Not hypertrophic or keloidal at this time.  Return in about 6 months (around 06/03/2023) for Keloid .  IMarye Round, CMA, am acting as scribe for Sarina Ser, MD .  Documentation: I have reviewed the above documentation for accuracy and completeness, and I agree with the above.  Sarina Ser, MD

## 2022-12-05 ENCOUNTER — Encounter: Payer: Self-pay | Admitting: Dermatology

## 2023-02-05 ENCOUNTER — Ambulatory Visit (INDEPENDENT_AMBULATORY_CARE_PROVIDER_SITE_OTHER): Payer: 59 | Admitting: Advanced Practice Midwife

## 2023-02-05 ENCOUNTER — Encounter: Payer: Self-pay | Admitting: Advanced Practice Midwife

## 2023-02-05 VITALS — BP 139/82 | HR 98 | Ht 66.5 in | Wt 202.0 lb

## 2023-02-05 DIAGNOSIS — Z9889 Other specified postprocedural states: Secondary | ICD-10-CM | POA: Diagnosis not present

## 2023-02-05 DIAGNOSIS — Z8742 Personal history of other diseases of the female genital tract: Secondary | ICD-10-CM | POA: Diagnosis not present

## 2023-02-05 DIAGNOSIS — L910 Hypertrophic scar: Secondary | ICD-10-CM

## 2023-02-05 DIAGNOSIS — Z01419 Encounter for gynecological examination (general) (routine) without abnormal findings: Secondary | ICD-10-CM | POA: Insufficient documentation

## 2023-02-05 DIAGNOSIS — Z319 Encounter for procreative management, unspecified: Secondary | ICD-10-CM

## 2023-02-05 NOTE — Progress Notes (Signed)
GYNECOLOGY ANNUAL PREVENTATIVE CARE ENCOUNTER NOTE  History:     Barbara Baker is a 42 y.o. G54P1011 female here for a routine annual gynecologic exam.  Current complaints: none.    Patient has been abstinent for about ten years. She is considering trying to conceive soon. She has general questions regarding potential fertility and worries that she is "too old" to become pregnant. Denies abnormal vaginal bleeding, discharge, pelvic pain, problems with intercourse or other gynecologic concerns.    Gynecologic History Patient's last menstrual period was 01/23/2023 (exact date). Contraception: none Last Pap: Remote. Result was normal with negative HPV Last Mammogram: 03/2022 .  Result was abnormal: asymmetry with subsequent left biopsy  Obstetric History OB History  Gravida Para Term Preterm AB Living  '2 1 1   1 1  '$ SAB IAB Ectopic Multiple Live Births  1       1    # Outcome Date GA Lbr Len/2nd Weight Sex Delivery Anes PTL Lv  2 Term 05/31/08    M    LIV  1 SAB             Past Medical History:  Diagnosis Date   Dermatitis    Seborrheic dermatitis    Past Surgical History:  Procedure Laterality Date   BREAST BIOPSY Left 04/30/2022   stereo biopsy/x clip. path pending   BREAST REDUCTION SURGERY Bilateral 06/20/2022   Procedure: MAMMARY REDUCTION  (BREAST);  Surgeon: Irene Limbo, MD;  Location: [facility];  Service: Plastics;  Laterality: Bilateral;   DILATION AND CURETTAGE OF UTERUS      Current Outpatient Medications on File Prior to Visit  Medication Sig Dispense Refill   Ciclopirox 1 % shampoo Apply 1 application  topically once a week. Apply to scalp let sit 5 minutes and rinse out 120 mL 6   clobetasol (OLUX) 0.05 % topical foam Apply topically as directed. Qd aa psoriasis on scalp up to 5 day per week, avoid face, groin, axilla 100 g 6   Fluocinolone Acetonide Body 0.01 % OIL Apply 1 application  topically daily. Qd to aa scalp prn flares 118  mL 2   fluticasone (FLONASE) 50 MCG/ACT nasal spray once daily as needed     hydrocortisone 2.5 % cream Apply topically 2 (two) times daily as needed (Rash). apply to affected areas on her face once a day Mon, Wednesday and Friday 30 g 11   hydrocortisone 2.5 % lotion Apply topically as directed. Apply to scaly areas on face Tuesday, Thursday and Friday at bedtime prn flares 59 mL 6   loratadine (CLARITIN) 10 MG tablet Take 10 mg by mouth daily.     meclizine (ANTIVERT) 25 MG tablet Take 25 mg by mouth 3 (three) times daily as needed.     Phendimetrazine Tartrate 105 MG CP24 Take by mouth.     pimecrolimus (ELIDEL) 1 % cream Apply topically.     SUMAtriptan (IMITREX) 25 MG tablet Take by mouth.     TRI-LO-SPRINTEC 0.18/0.215/0.25 MG-25 MCG tab Take 1 tablet by mouth daily. (Patient not taking: Reported on 02/05/2023)     No current facility-administered medications on file prior to visit.    Allergies  Allergen Reactions   Sulfa Antibiotics Itching    Social History:  reports that she has never smoked. She has never used smokeless tobacco. She reports that she does not drink alcohol and does not use drugs.  Family History  Problem Relation Age of Onset  Ovarian cancer Mother 79   Psoriasis Son    Eczema Son     The following portions of the patient's history were reviewed and updated as appropriate: allergies, current medications, past family history, past medical history, past social history, past surgical history and problem list.  Review of Systems Pertinent items noted in HPI and remainder of comprehensive ROS otherwise negative.  Physical Exam:  BP 139/82   Pulse 98   Ht 5' 6.5" (1.689 m)   Wt 202 lb (91.6 kg)   LMP 01/23/2023 (Exact Date)   BMI 32.12 kg/m  CONSTITUTIONAL: Well-developed, well-nourished female in no acute distress.  HENT:  Normocephalic, atraumatic, External right and left ear normal.  EYES: Conjunctivae and EOM are normal. Pupils are equal, round, and  reactive to light. No scleral icterus.  SKIN: Skin is warm and dry. No rash noted. Not diaphoretic. No erythema. No pallor. MUSCULOSKELETAL: Normal range of motion. No tenderness.  No cyanosis, clubbing, or edema. NEUROLOGIC: Alert and oriented to person, place, and time. Normal reflexes, muscle tone coordination.  PSYCHIATRIC: Normal mood and affect. Normal behavior. Normal judgment and thought content. CARDIOVASCULAR: Normal heart rate noted, regular rhythm RESPIRATORY: Clear to auscultation bilaterally. Effort and breath sounds normal, no problems with respiration noted. BREASTS: Symmetric in size. No masses, tenderness, skin changes, nipple drainage, or lymphadenopathy bilaterally. Scarring WNL for history of bilateral breast reduction ABDOMEN: Soft, no distention noted.  No tenderness, rebound or guarding.  PELVIC: Normal appearing external genitalia and urethral meatus; normal appearing vaginal mucosa and cervix.  No abnormal vaginal discharge noted.  Pap smear obtained. Performed in the presence of a chaperone.   Assessment and Plan:    1. Well woman exam with routine gynecological exam - No concerning findings on physical exam - IGP,Aptima HPV,CtNg Age Gdln  2. Patient desires pregnancy - Discussed definition of infertility at age 82 (turns 12 in two months) - Advised initiation of prenatal vitamin with folic acid 3 months before trying to conceive - Can review apps to track ovulation  3. Hx of abnormal cervical Pap smear - Remote history >14 years ago with follow-up Colposcopy - All follow-up pap smears normal per patient report  4. Hx of colposcopy with cervical biopsy   5. Keloid of skin - Already managed by Dermatology  Will follow up results of pap smear and manage accordingly. Mammogram scheduled  Mallie Snooks, [place], MSN, CNM Certified Nurse Midwife, Product/process development scientist for [employer], [place]

## 2023-02-08 ENCOUNTER — Ambulatory Visit: Payer: Self-pay

## 2023-02-13 LAB — IGP, APTIMA HPV, RFX 16/18,45
HPV Aptima: NEGATIVE
PAP Smear Comment: 0

## 2023-02-13 LAB — IGP,APTIMA HPV,CTNG AGE GDLN

## 2023-06-08 ENCOUNTER — Ambulatory Visit
Admission: EM | Admit: 2023-06-08 | Discharge: 2023-06-08 | Disposition: A | Discharge status: Home / Self Care | Payer: 59 | Attending: Emergency Medicine | Admitting: Emergency Medicine

## 2023-06-08 DIAGNOSIS — Z3202 Encounter for pregnancy test, result negative: Secondary | ICD-10-CM | POA: Diagnosis not present

## 2023-06-08 DIAGNOSIS — Z113 Encounter for screening for infections with a predominantly sexual mode of transmission: Secondary | ICD-10-CM | POA: Diagnosis not present

## 2023-06-08 DIAGNOSIS — N898 Other specified noninflammatory disorders of vagina: Secondary | ICD-10-CM

## 2023-06-08 LAB — POCT URINALYSIS DIP (MANUAL ENTRY)
Bilirubin, UA: NEGATIVE
Glucose, UA: NEGATIVE mg/dL
Ketones, POC UA: NEGATIVE mg/dL
Leukocytes, UA: NEGATIVE
Nitrite, UA: NEGATIVE
Protein Ur, POC: NEGATIVE mg/dL
Spec Grav, UA: 1.02 (ref 1.010–1.025)
Urobilinogen, UA: 0.2 E.U./dL
pH, UA: 6 (ref 5.0–8.0)

## 2023-06-08 LAB — POCT URINE PREGNANCY: Preg Test, Ur: NEGATIVE

## 2023-06-08 NOTE — ED Triage Notes (Addendum)
Patient to Urgent Care with complaints of vaginal irritation and discharge/ foul smelling urine.   Symptoms started 3 weeks ago. Requests STD/ blood work.

## 2023-06-08 NOTE — Discharge Instructions (Signed)
Your tests are pending.  If your test results are positive, we will call you.  You and your sexual partner(s) may require treatment at that time.  Do not have sexual activity for at least 7 days.    Follow up with your primary care provider if your symptoms are not improving.    

## 2023-06-08 NOTE — ED Provider Notes (Signed)
Barbara Baker    CSN: [id number] Arrival date & time: 06/08/23  1645      History   Chief Complaint Chief Complaint  Patient presents with   Vaginal Discharge    Entered by patient    HPI Barbara Baker is a 42 y.o. female.  Patient presents with vaginal discharge, vaginal irritation, malodorous urine x 3 weeks.  She requests STD testing.  She denies fever, rash, abdominal pain, flank pain, hematuria, pelvic pain, or other symptoms.  She denies pertinent medical history. The history is provided by the patient and medical records.    Past Medical History:  Diagnosis Date   Dermatitis    Seborrheic dermatitis    Patient Active Problem List   Diagnosis Date Noted   Well woman exam with routine gynecological exam 02/05/2023    Past Surgical History:  Procedure Laterality Date   BREAST BIOPSY Left 04/30/2022   stereo biopsy/x clip. path pending   BREAST REDUCTION SURGERY Bilateral 06/20/2022   Procedure: MAMMARY REDUCTION  (BREAST);  Surgeon: Glenna Fellows, MD;  Location: [facility];  Service: Plastics;  Laterality: Bilateral;   DILATION AND CURETTAGE OF UTERUS      OB History     Gravida  2   Para  1   Term  1   Preterm      AB  1   Living  1      SAB  1   IAB      Ectopic      Multiple      Live Births  1            Home Medications    Prior to Admission medications   Medication Sig Start Date End Date Taking? Authorizing Provider  Ciclopirox 1 % shampoo Apply 1 application  topically once a week. Apply to scalp let sit 5 minutes and rinse out 12/03/22   Deirdre Evener, MD  clobetasol (OLUX) 0.05 % topical foam Apply topically as directed. Qd aa psoriasis on scalp up to 5 day per week, avoid face, groin, axilla 12/03/22   Deirdre Evener, MD  Fluocinolone Acetonide Body 0.01 % OIL Apply 1 application  topically daily. Qd to aa scalp prn flares 12/03/22   Deirdre Evener, MD  fluticasone [facility]) 50 MCG/ACT  nasal spray once daily as needed 05/01/19   [provider]  hydrocortisone 2.5 % cream Apply topically 2 (two) times daily as needed (Rash). apply to affected areas on her face once a day Mon, Wednesday and Friday 12/03/22   Deirdre Evener, MD  hydrocortisone 2.5 % lotion Apply topically as directed. Apply to scaly areas on face Tuesday, Thursday and Friday at bedtime prn flares 01/16/21   Deirdre Evener, MD  loratadine (CLARITIN) 10 MG tablet Take 10 mg by mouth daily. 03/10/20   [provider]  meclizine (ANTIVERT) 25 MG tablet Take 25 mg by mouth 3 (three) times daily as needed. 03/10/20   [provider]  Phendimetrazine Tartrate 105 MG CP24 Take by mouth. 03/09/20   [provider]  pimecrolimus (ELIDEL) 1 % cream Apply topically. 08/08/18   [provider]  SUMAtriptan (IMITREX) 25 MG tablet Take by mouth. 03/09/20 03/09/21  [provider]  TRI-LO-SPRINTEC 0.18/0.215/0.25 MG-25 MCG tab Take 1 tablet by mouth daily. Patient not taking: Reported on 02/05/2023 01/16/20   [provider]  WEGOVY 1.7 MG/0.75ML SOAJ PLEASE SEE ATTACHED FOR DETAILED DIRECTIONS  [provider]    Family History Family History  Problem Relation Age of Onset   Ovarian cancer Mother 76   Psoriasis Son    Eczema Son     Social History Social History   Tobacco Use   Smoking status: Never   Smokeless tobacco: Never  Vaping Use   Vaping Use: Never used  Substance Use Topics   Alcohol use: Never   Drug use: Never     Allergies   Sulfa antibiotics   Review of Systems Review of Systems  Constitutional:  Negative for chills and fever.  Gastrointestinal:  Negative for abdominal pain, nausea and vomiting.  Genitourinary:  Positive for vaginal discharge. Negative for dysuria, flank pain, hematuria and pelvic pain.       Malodorous urine  Musculoskeletal:  Negative for arthralgias and back pain.  Skin:  Negative for color change and rash.   All other systems reviewed and are negative.    Physical Exam Triage Vital Signs ED Triage Vitals  Enc Vitals Group     BP 06/08/23 1727 122/82     Pulse Rate 06/08/23 1722 (!) 103     Resp 06/08/23 1722 18     Temp 06/08/23 1722 97.9 F (36.6 C)     Temp src --      SpO2 06/08/23 1722 98 %     Weight --      Height --      Head Circumference --      Peak Flow --      Pain Score 06/08/23 1719 1     Pain Loc --      Pain Edu? --      Excl. in GC? --    No data found.  Updated Vital Signs BP 122/82   Pulse (!) 103   Temp 97.9 F (36.6 C)   Resp 18   LMP 06/01/2023   SpO2 98%   Visual Acuity Right Eye Distance:   Left Eye Distance:   Bilateral Distance:    Right Eye Near:   Left Eye Near:    Bilateral Near:     Physical Exam Vitals and nursing note reviewed.  Constitutional:      General: She is not in acute distress.    Appearance: She is well-developed.  HENT:     Mouth/Throat:     Mouth: Mucous membranes are moist.  Cardiovascular:     Rate and Rhythm: Normal rate and regular rhythm.     Heart sounds: Normal heart sounds.  Pulmonary:     Effort: Pulmonary effort is normal. No respiratory distress.     Breath sounds: Normal breath sounds.  Abdominal:     General: Bowel sounds are normal.     Palpations: Abdomen is soft.     Tenderness: There is no abdominal tenderness. There is no right CVA tenderness, left CVA tenderness, guarding or rebound.  Musculoskeletal:     Cervical back: Neck supple.  Skin:    General: Skin is warm and dry.  Neurological:     Mental Status: She is alert.  Psychiatric:        Mood and Affect: Mood normal.        Behavior: Behavior normal.      UC Treatments / Results  Labs (all labs ordered are listed, but only abnormal results are displayed) Labs Reviewed  POCT URINALYSIS DIP (MANUAL ENTRY) - Abnormal; Notable for the following components:      Result Value   Blood,  UA trace-intact (*)    All other components  within normal limits  RPR  HIV ANTIBODY (ROUTINE TESTING W REFLEX)  POCT URINE PREGNANCY  CERVICOVAGINAL ANCILLARY ONLY    EKG   Radiology No results found.  Procedures Procedures (including critical care time)  Medications Ordered in UC Medications - No data to display  Initial Impression / Assessment and Plan / UC Course  I have reviewed the triage vital signs and the nursing notes.  Pertinent labs & imaging results that were available during my care of the patient were reviewed by me and considered in my medical decision making (see chart for details).    Vaginal irritation, negative pregnancy test, STD screening.  Patient obtained vaginal self swab for testing.  HIV and syphilis pending.  Discussed that we will call if test results are positive.  Discussed that she may require treatment at that time.  Discussed that sexual partner(s) may also require treatment.  Instructed patient to abstain from sexual activity for at least 7 days.  Instructed her to follow-up with her PCP or gynecologist if her symptoms are not improving.  Patient agrees to plan of care.   Final Clinical Impressions(s) / UC Diagnoses   Final diagnoses:  Vaginal irritation  Screening for STD (sexually transmitted disease)  Negative pregnancy test     Discharge Instructions      Your tests are pending.  If your test results are positive, we will call you.  You and your sexual partner(s) may require treatment at that time.  Do not have sexual activity for at least 7 days.    Follow up with your primary care provider if your symptoms are not improving.         ED Prescriptions   None    PDMP not reviewed this encounter.   Mickie Bail, NP 06/08/23 [phone]

## 2023-06-09 ENCOUNTER — Telehealth: Payer: Self-pay | Admitting: Emergency Medicine

## 2023-06-09 LAB — HIV ANTIBODY (ROUTINE TESTING W REFLEX): HIV Screen 4th Generation wRfx: NONREACTIVE

## 2023-06-09 LAB — CERVICOVAGINAL ANCILLARY ONLY
Bacterial Vaginitis (gardnerella): NEGATIVE
Candida Glabrata: NEGATIVE
Candida Vaginitis: POSITIVE — AB
Chlamydia: NEGATIVE
Comment: NEGATIVE
Comment: NEGATIVE
Comment: NEGATIVE
Comment: NEGATIVE
Comment: NEGATIVE
Comment: NORMAL
Neisseria Gonorrhea: NEGATIVE
Trichomonas: NEGATIVE

## 2023-06-09 LAB — RPR: RPR Ser Ql: NONREACTIVE

## 2023-06-09 MED ORDER — FLUCONAZOLE 150 MG PO TABS: 150.0000 mg | ORAL_TABLET | Freq: Once | ORAL | 0 refills | Status: AC

## 2023-06-10 ENCOUNTER — Ambulatory Visit: Payer: 59 | Admitting: Dermatology

## 2023-09-09 ENCOUNTER — Other Ambulatory Visit: Payer: 59

## 2023-09-09 DIAGNOSIS — Z319 Encounter for procreative management, unspecified: Secondary | ICD-10-CM

## 2023-09-09 NOTE — Progress Notes (Unsigned)
Pt requesting HCG, has new OB scheduled

## 2023-09-10 LAB — BETA HCG QUANT (REF LAB): hCG Quant: 630 m[IU]/mL

## 2023-09-16 ENCOUNTER — Other Ambulatory Visit: Payer: Self-pay

## 2023-09-16 ENCOUNTER — Inpatient Hospital Stay (HOSPITAL_COMMUNITY)
Admission: AD | Admit: 2023-09-16 | Discharge: 2023-09-17 | Discharge status: Against Medical Advice | Payer: 59 | Attending: Obstetrics & Gynecology | Admitting: Obstetrics & Gynecology

## 2023-09-16 ENCOUNTER — Inpatient Hospital Stay (HOSPITAL_COMMUNITY): Payer: 59

## 2023-09-16 ENCOUNTER — Encounter (HOSPITAL_COMMUNITY): Payer: Self-pay

## 2023-09-16 DIAGNOSIS — Z3A01 Less than 8 weeks gestation of pregnancy: Secondary | ICD-10-CM | POA: Insufficient documentation

## 2023-09-16 DIAGNOSIS — A5901 Trichomonal vulvovaginitis: Secondary | ICD-10-CM

## 2023-09-16 DIAGNOSIS — O26899 Other specified pregnancy related conditions, unspecified trimester: Secondary | ICD-10-CM

## 2023-09-16 DIAGNOSIS — O26891 Other specified pregnancy related conditions, first trimester: Secondary | ICD-10-CM | POA: Diagnosis present

## 2023-09-16 LAB — CBC
HCT: 37 % (ref 36.0–46.0)
Hemoglobin: 12.9 g/dL (ref 12.0–15.0)
MCH: 29.1 pg (ref 26.0–34.0)
MCHC: 34.9 g/dL (ref 30.0–36.0)
MCV: 83.3 fL (ref 80.0–100.0)
Platelets: 398 10*3/uL (ref 150–400)
RBC: 4.44 MIL/uL (ref 3.87–5.11)
RDW: 13.5 % (ref 11.5–15.5)
WBC: 10.4 10*3/uL (ref 4.0–10.5)
nRBC: 0 % (ref 0.0–0.2)

## 2023-09-16 LAB — WET PREP, GENITAL
Clue Cells Wet Prep HPF POC: NONE SEEN
Sperm: NONE SEEN
WBC, Wet Prep HPF POC: >=10 — AB (ref <10)
Yeast Wet Prep HPF POC: NONE SEEN

## 2023-09-16 LAB — URINALYSIS, ROUTINE W REFLEX MICROSCOPIC
Bilirubin Urine: NEGATIVE
Glucose, UA: NEGATIVE mg/dL
Hgb urine dipstick: NEGATIVE
Ketones, ur: 5 mg/dL — AB
Nitrite: NEGATIVE
Protein, ur: NEGATIVE mg/dL
Specific Gravity, Urine: 1.004 — ABNORMAL LOW (ref 1.005–1.030)
pH: 6 (ref 5.0–8.0)

## 2023-09-16 LAB — HCG, QUANTITATIVE, PREGNANCY: hCG, Beta Chain, Quant, S: 20205 m[IU]/mL — ABNORMAL HIGH (ref <5)

## 2023-09-16 LAB — ABO/RH: ABO/RH(D): AB POS

## 2023-09-16 NOTE — MAU Note (Signed)
Barbara Baker is a 42 y.o. at Unknown here in MAU reporting: she's having constant sharp RLQ pain that began yesterday. Denies VB. LMP: 08/14/2023 Onset of complaint: yesterday Pain score: 7 Vitals:   09/16/23 1756  BP: 121/72  Pulse: (!) 103  Resp: 18  Temp: 98.5 F (36.9 C)  SpO2: 98%     FHT:NA Lab orders placed from triage:   UA

## 2023-09-17 ENCOUNTER — Ambulatory Visit: Payer: 59 | Admitting: Dermatology

## 2023-09-17 ENCOUNTER — Encounter (HOSPITAL_BASED_OUTPATIENT_CLINIC_OR_DEPARTMENT_OTHER): Payer: Self-pay | Admitting: Advanced Practice Midwife

## 2023-09-17 ENCOUNTER — Other Ambulatory Visit (HOSPITAL_BASED_OUTPATIENT_CLINIC_OR_DEPARTMENT_OTHER): Payer: Self-pay | Admitting: Advanced Practice Midwife

## 2023-09-17 DIAGNOSIS — R109 Unspecified abdominal pain: Secondary | ICD-10-CM

## 2023-09-17 DIAGNOSIS — A5901 Trichomonal vulvovaginitis: Secondary | ICD-10-CM

## 2023-09-17 LAB — GC/CHLAMYDIA PROBE AMP (~~LOC~~) NOT AT ARMC
Chlamydia: NEGATIVE
Comment: NEGATIVE
Comment: NORMAL
Neisseria Gonorrhea: NEGATIVE

## 2023-09-17 MED ORDER — METRONIDAZOLE 500 MG PO TABS
500.0000 mg | ORAL_TABLET | Freq: Two times a day (BID) | ORAL | 0 refills | Status: AC
Start: 2023-09-17 — End: 2023-09-24

## 2023-09-17 NOTE — MAU Provider Note (Signed)
Chief Complaint: Abdominal Pain   None     SUBJECTIVE HPI: Barbara Baker is a 42 y.o. G3P1011 at [redacted]w[redacted]d by LMP who presents to maternity admissions reporting abdominal pain.    HPI  Past Medical History:  Diagnosis Date   Dermatitis    Seborrheic dermatitis   Past Surgical History:  Procedure Laterality Date   BREAST BIOPSY Left 04/30/2022   stereo biopsy/x clip. path pending   BREAST REDUCTION SURGERY Bilateral 06/20/2022   Procedure: MAMMARY REDUCTION  (BREAST);  Surgeon: Glenna Fellows, MD;  Location: [facility];  Service: Plastics;  Laterality: Bilateral;   DILATION AND CURETTAGE OF UTERUS     Social History   Socioeconomic History   Marital status: Single    Spouse name: Not on file   Number of children: Not on file   Years of education: Not on file   Highest education level: Not on file  Occupational History   Not on file  Tobacco Use   Smoking status: Never   Smokeless tobacco: Never  Vaping Use   Vaping status: Never Used  Substance and Sexual Activity   Alcohol use: Never   Drug use: Never   Sexual activity: Not Currently    Birth control/protection: Pill    Comment: currently not taking  Other Topics Concern   Not on file  Social History Narrative   Not on file   Social Determinants of Health   Financial Resource Strain: Low Risk  (04/17/2023)   Received from [facility], [facility]   Overall Financial Resource Strain (CARDIA)    Difficulty of Paying Living Expenses: Not hard at all  Food Insecurity: No Food Insecurity (04/17/2023)   Received from [facility], [facility]   Hunger Vital Sign    Worried About Running Out of Food in the Last Year: Never true    Ran Out of Food in the Last Year: Never true  Transportation Needs: No Transportation Needs (04/17/2023)   Received from [facility], [facility]   [facility] -  Transportation    In the past 12 months, has lack of transportation kept you from medical appointments or from getting medications?: No    Lack of Transportation (Non-Medical): No  Physical Activity: Not on file  Stress: Not on file  Social Connections: Not on file  Intimate Partner Violence: Not on file   No current facility-administered medications on file prior to encounter.   Current Outpatient Medications on File Prior to Encounter  Medication Sig Dispense Refill   Ciclopirox 1 % shampoo Apply 1 application  topically once a week. Apply to scalp let sit 5 minutes and rinse out 120 mL 6   clobetasol (OLUX) 0.05 % topical foam Apply topically as directed. Qd aa psoriasis on scalp up to 5 day per week, avoid face, groin, axilla 100 g 6   Fluocinolone Acetonide Body 0.01 % OIL Apply 1 application  topically daily. Qd to aa scalp prn flares 118 mL 2   fluticasone (FLONASE) 50 MCG/ACT nasal spray once daily as needed     hydrocortisone 2.5 % cream Apply topically 2 (two) times daily as needed (Rash). apply to affected areas on her face once a day Mon, Wednesday and Friday 30 g 11   hydrocortisone 2.5 % lotion Apply topically as directed. Apply to scaly areas on face Tuesday, Thursday and Friday at bedtime prn flares 59 mL 6   loratadine (  CLARITIN) 10 MG tablet Take 10 mg by mouth daily.     meclizine (ANTIVERT) 25 MG tablet Take 25 mg by mouth 3 (three) times daily as needed.     Phendimetrazine Tartrate 105 MG CP24 Take by mouth.     pimecrolimus (ELIDEL) 1 % cream Apply topically.     SUMAtriptan (IMITREX) 25 MG tablet Take by mouth.     TRI-LO-SPRINTEC 0.18/0.215/0.25 MG-25 MCG tab Take 1 tablet by mouth daily. (Patient not taking: Reported on 02/05/2023)     WEGOVY 1.7 MG/0.75ML SOAJ PLEASE SEE ATTACHED FOR DETAILED DIRECTIONS     Allergies  Allergen Reactions   Sulfa Antibiotics Itching    ROS:  Review of Systems  Constitutional:  Negative for chills, fatigue and fever.   Respiratory:  Negative for shortness of breath.   Cardiovascular:  Negative for chest pain.  Gastrointestinal:  Positive for abdominal pain.  Genitourinary:  Negative for difficulty urinating, dysuria, flank pain, pelvic pain, vaginal bleeding, vaginal discharge and vaginal pain.  Neurological:  Negative for dizziness and headaches.  Psychiatric/Behavioral: Negative.       I have reviewed patient's Past Medical Hx, Surgical Hx, Family Hx, Social Hx, medications and allergies.   Physical Exam  Patient Vitals for the past 24 hrs:  BP Temp Temp src Pulse Resp SpO2 Height Weight  09/16/23 1756 121/72 98.5 F (36.9 C) Oral (!) 103 18 98 % -- --  09/16/23 1752 -- -- -- -- -- -- 5' 6.5" (1.689 m) 78.5 kg   Constitutional: Well-developed, well-nourished female in no acute distress.  Cardiovascular: normal rate Respiratory: normal effort GI: Abd soft, non-tender. Pos BS x 4 MS: Extremities nontender, no edema, normal ROM Neurologic: Alert and oriented x 4.  GU: Neg CVAT.  PELVIC EXAM: Deferred    LAB RESULTS Results for orders placed or performed during the hospital encounter of 09/16/23 (from the past 24 hour(s))  Wet prep, genital     Status: Abnormal   Collection Time: 09/16/23  5:59 PM   Specimen: PATH Cytology Cervicovaginal Ancillary Only  Result Value Ref Range   Yeast Wet Prep HPF POC NONE SEEN NONE SEEN   Trich, Wet Prep PRESENT (A) NONE SEEN   Clue Cells Wet Prep HPF POC NONE SEEN NONE SEEN   WBC, Wet Prep HPF POC >=10 (A) <10   Sperm NONE SEEN   Urinalysis, Routine w reflex microscopic -Urine, Clean Catch     Status: Abnormal   Collection Time: 09/16/23  6:00 PM  Result Value Ref Range   Color, Urine YELLOW YELLOW   APPearance HAZY (A) CLEAR   Specific Gravity, Urine 1.004 (L) 1.005 - 1.030   pH 6.0 5.0 - 8.0   Glucose, UA NEGATIVE NEGATIVE mg/dL   Hgb urine dipstick NEGATIVE NEGATIVE   Bilirubin Urine NEGATIVE NEGATIVE   Ketones, ur 5 (A) NEGATIVE mg/dL    Protein, ur NEGATIVE NEGATIVE mg/dL   Nitrite NEGATIVE NEGATIVE   Leukocytes,Ua MODERATE (A) NEGATIVE   RBC / HPF 0-5 0 - 5 RBC/hpf   WBC, UA 21-50 0 - 5 WBC/hpf   Bacteria, UA RARE (A) NONE SEEN   Squamous Epithelial / HPF 0-5 0 - 5 /HPF   Mucus PRESENT   hCG, quantitative, pregnancy     Status: Abnormal   Collection Time: 09/16/23  7:03 PM  Result Value Ref Range   hCG, Beta Chain, Quant, S 20,205 (H) <5 mIU/mL  CBC     Status: None   Collection Time: 09/16/23  7:03 PM  Result Value Ref Range   WBC 10.4 4.0 - 10.5 K/uL   RBC 4.44 3.87 - 5.11 MIL/uL   Hemoglobin 12.9 12.0 - 15.0 g/dL   HCT 16.1 09.6 - 04.5 %   MCV 83.3 80.0 - 100.0 fL   MCH 29.1 26.0 - 34.0 pg   MCHC 34.9 30.0 - 36.0 g/dL   RDW 40.9 81.1 - 91.4 %   Platelets 398 150 - 400 K/uL   nRBC 0.0 0.0 - 0.2 %  ABO/Rh     Status: None   Collection Time: 09/16/23  7:05 PM  Result Value Ref Range   ABO/RH(D)      AB POS Performed at [facility], [address] [place], [place] [address]     --/--/AB POS Performed at [facility], [address] [place], [place] [address]  [phone])  IMAGING US OB LESS THAN 14 WEEKS WITH [place] TRANSVAGINAL  Result Date: 09/17/2023 CLINICAL DATA:  [id number]. Abdominal pain during pregnancy in first trimester. EXAM: OBSTETRIC <14 WK [place] AND TRANSVAGINAL OB US TECHNIQUE: Both transabdominal and transvaginal ultrasound examinations were performed for complete evaluation of the gestation as well as the maternal uterus, adnexal regions, and pelvic cul-de-sac. Transvaginal technique was performed to assess early pregnancy. COMPARISON:  None Available. FINDINGS: Intrauterine gestational sac: Single. Yolk sac:  Visualized.  Measures 4.3 mm. Embryo:  Not visualized. Cardiac Activity: N/a. MSD: 10.9 mm   5 w   6 d +/-10 days CRL: Not yet seen.  [place] EDC: 05/12/2024. Subchorionic hemorrhage:  None visualized. Maternal uterus/adnexae: The uterus is 9.7 x 4.8 x 7.3 cm, anteverted without  visible wall mass. The cervix is closed. Right ovary is 3.6 x 4.0 x 2.7 cm without focal abnormality. Left ovary is 3.4 x 4.4 x 3.2 cm without focal abnormality. No free fluid or adnexal mass are seen. Visualized portions of the bladder are unremarkable allowing for nondistention. IMPRESSION: Probable early dates intrauterine pregnancy with yolk sac visible but no fetal pole or cardiac activity yet visualized. Recommend follow-up quantitative B-HCG levels and follow-up US in 14 days to assess viability. This recommendation follows SRU consensus guidelines: Diagnostic Criteria for Nonviable Pregnancy Early in the First Trimester. Malva Limes Med 2013; 528:4132-44. MSD EGA [redacted] weeks 6 days, [facility] 05/12/2024. Electronically Signed   By: Almira Bar M.D.   On: 09/17/2023 00:52    MAU Management/MDM: Orders Placed This Encounter  Procedures   Wet prep, genital   Culture, OB Urine   US OB LESS THAN 14 WEEKS WITH OB TRANSVAGINAL   Urinalysis, Routine w reflex microscopic -Urine, Clean Catch   hCG, quantitative, pregnancy   CBC   ABO/Rh    No orders of the defined types were placed in this encounter.   IUP noted on [place] today. Trichomonas present on wet prep, which is likely cause of pt symptoms. Pt not in lobby when called by RN for provider to present results.  MyChart message sent, outpatient [place] ordered, and Rx for Flagyl to treat trichomonas sent to pharmacy.   ASSESSMENT 1. Abdominal pain affecting pregnancy   2. Trichomonal vaginitis during pregnancy in first trimester   3. [redacted] weeks gestation of pregnancy     PLAN Discharge home  Pt left from lobby and discharge results were not presented by provider or RN.  My Chart message was sent, see separate note.   Allergies as of 09/17/2023       Reactions  Sulfa Antibiotics Itching        Medication List     ASK your doctor about these medications    Ciclopirox 1 % shampoo Apply 1 application  topically once a week. Apply to scalp let sit  5 minutes and rinse out   clobetasol 0.05 % topical foam Commonly known as: OLUX Apply topically as directed. Qd aa psoriasis on scalp up to 5 day per week, avoid face, groin, axilla   Fluocinolone Acetonide Body 0.01 % Oil Apply 1 application  topically daily. Qd to aa scalp prn flares   fluticasone 50 MCG/ACT nasal spray Commonly known as: FLONASE once daily as needed   hydrocortisone 2.5 % lotion Apply topically as directed. Apply to scaly areas on face Tuesday, Thursday and Friday at bedtime prn flares   hydrocortisone 2.5 % cream Apply topically 2 (two) times daily as needed (Rash). apply to affected areas on her face once a day Mon, Wednesday and Friday   loratadine 10 MG tablet Commonly known as: CLARITIN Take 10 mg by mouth daily.   meclizine 25 MG tablet Commonly known as: ANTIVERT Take 25 mg by mouth 3 (three) times daily as needed.   Phendimetrazine Tartrate 105 MG Cp24 Take by mouth.   pimecrolimus 1 % cream Commonly known as: ELIDEL Apply topically.   SUMAtriptan 25 MG tablet Commonly known as: IMITREX Take by mouth.   Tri-Lo-Sprintec 0.18/0.215/0.25 MG-25 MCG tab Generic drug: Norgestimate-Ethinyl Estradiol Triphasic Take 1 tablet by mouth daily.   Wegovy 1.7 MG/0.75ML Soaj Generic drug: Semaglutide-Weight Management PLEASE SEE ATTACHED FOR DETAILED DIRECTIONS         Sharen Counter Certified Nurse-Midwife 09/17/2023  8:08 AM

## 2023-09-17 NOTE — Progress Notes (Signed)
Outpatient [place] ordered, order placed greater than 2 hours after MAU visit so placed as orders only.  Message sent to pt via MyChart about lab and [place] results and plan for outpatient [place] in 7-10 days.

## 2023-09-17 NOTE — MAU Note (Signed)
Called patient from lobby. No answer Misty Stanley, CNM informed.

## 2023-09-18 LAB — CULTURE, OB URINE: Culture: 1000 — AB

## 2023-09-30 ENCOUNTER — Ambulatory Visit: Payer: 59 | Admitting: *Deleted

## 2023-09-30 ENCOUNTER — Other Ambulatory Visit (INDEPENDENT_AMBULATORY_CARE_PROVIDER_SITE_OTHER): Payer: 59

## 2023-09-30 VITALS — BP 124/78 | HR 101 | Wt 174.0 lb

## 2023-09-30 DIAGNOSIS — O3680X0 Pregnancy with inconclusive fetal viability, not applicable or unspecified: Secondary | ICD-10-CM

## 2023-09-30 DIAGNOSIS — Z3481 Encounter for supervision of other normal pregnancy, first trimester: Secondary | ICD-10-CM

## 2023-09-30 DIAGNOSIS — Z3A01 Less than 8 weeks gestation of pregnancy: Secondary | ICD-10-CM

## 2023-09-30 DIAGNOSIS — O0990 Supervision of high risk pregnancy, unspecified, unspecified trimester: Secondary | ICD-10-CM | POA: Insufficient documentation

## 2023-09-30 DIAGNOSIS — O09529 Supervision of elderly multigravida, unspecified trimester: Secondary | ICD-10-CM | POA: Insufficient documentation

## 2023-09-30 NOTE — Progress Notes (Signed)
New OB Intake  I explained I am completing New OB Intake today. We discussed EDD of 05/20/2024, by Last Menstrual Period. Pt is G3P1011. I reviewed her allergies, medications and Medical/Surgical/OB history.    Patient Active Problem List   Diagnosis Date Noted   Supervision of high risk pregnancy, antepartum 09/30/2023   Maternal age 40+, multigravida, antepartum 09/30/2023    Concerns addressed today  Patient informed that the ultrasound is considered a limited obstetric ultrasound and is not intended to be a complete ultrasound exam.  Patient also informed that the ultrasound is not being completed with the intent of assessing for fetal or placental anomalies or any pelvic abnormalities. Explained that the purpose of today's ultrasound is to assess for viability.  Patient acknowledges the purpose of the exam and the limitations of the study.     Delivery Plans Plans to deliver at [facility]. Discussed the nature of our practice with multiple providers including residents and students. Due to the size of the practice, the delivering provider may not be the same as those providing prenatal care.   MyChart/Babyscripts MyChart access verified. I explained pt will have some visits in office and some virtually. Babyscripts app discussed and ordered.   Blood Pressure Cuff Blood pressure cuff discussed and given Discussed to be used for virtual visits and or if needed BP checks weekly.  Anatomy US Explained first scheduled [place] will be around 19 weeks.   Last Pap 02/05/2023-WNL  First visit review I reviewed new OB appt with patient. Explained pt will be seen by Dr Macon Large at first visit. Discussed Avelina Laine genetic screening with patient would like to get Mat 21. Routine prenatal labs to be collected at [facility].Marland Kitchen    Scheryl Marten, RN 09/30/2023  4:39 PM

## 2023-10-08 ENCOUNTER — Other Ambulatory Visit: Payer: Self-pay

## 2023-10-08 DIAGNOSIS — O0990 Supervision of high risk pregnancy, unspecified, unspecified trimester: Secondary | ICD-10-CM

## 2023-10-26 ENCOUNTER — Encounter: Payer: Self-pay | Admitting: Obstetrics & Gynecology

## 2023-11-09 ENCOUNTER — Ambulatory Visit (INDEPENDENT_AMBULATORY_CARE_PROVIDER_SITE_OTHER): Payer: 59 | Admitting: Obstetrics & Gynecology

## 2023-11-09 ENCOUNTER — Encounter: Payer: Self-pay | Admitting: Obstetrics & Gynecology

## 2023-11-09 VITALS — BP 120/76 | HR 98 | Wt 180.0 lb

## 2023-11-09 DIAGNOSIS — Z3A12 12 weeks gestation of pregnancy: Secondary | ICD-10-CM | POA: Diagnosis not present

## 2023-11-09 DIAGNOSIS — Z1339 Encounter for screening examination for other mental health and behavioral disorders: Secondary | ICD-10-CM | POA: Diagnosis not present

## 2023-11-09 DIAGNOSIS — O26891 Other specified pregnancy related conditions, first trimester: Secondary | ICD-10-CM

## 2023-11-09 DIAGNOSIS — O09521 Supervision of elderly multigravida, first trimester: Secondary | ICD-10-CM

## 2023-11-09 DIAGNOSIS — O09529 Supervision of elderly multigravida, unspecified trimester: Secondary | ICD-10-CM

## 2023-11-09 DIAGNOSIS — O2340 Unspecified infection of urinary tract in pregnancy, unspecified trimester: Secondary | ICD-10-CM | POA: Insufficient documentation

## 2023-11-09 DIAGNOSIS — O0990 Supervision of high risk pregnancy, unspecified, unspecified trimester: Secondary | ICD-10-CM

## 2023-11-09 DIAGNOSIS — B951 Streptococcus, group B, as the cause of diseases classified elsewhere: Secondary | ICD-10-CM | POA: Insufficient documentation

## 2023-11-09 MED ORDER — ASPIRIN 81 MG PO TBEC
81.0000 mg | DELAYED_RELEASE_TABLET | Freq: Every day | ORAL | 2 refills | Status: DC
Start: 2023-11-09 — End: 2024-04-27

## 2023-11-09 NOTE — Progress Notes (Signed)
History:   Barbara Baker is a 42 y.o. G3P1011 at [redacted]w[redacted]d by LMP, early ultrasound being seen today for her first obstetrical visit.  Her obstetrical history is significant for  one term SVD 15 years ago . Patient does intend to breast feed. Pregnancy history fully reviewed.  Patient reports no complaints.      HISTORY: OB History  Gravida Para Term Preterm AB Living  3 1 1  0 1 1  SAB IAB Ectopic Multiple Live Births  1 0 0 0 1    # Outcome Date GA Lbr Len/2nd Weight Sex Type Anes PTL Lv  3 Current           2 Term 05/31/08 [redacted]w[redacted]d  8 lb (3.629 kg) M Vag-Spont EPI  LIV     Birth Comments: [facility]  1 SAB             Past Medical History:  Diagnosis Date   Dermatitis    Seborrheic dermatitis   Past Surgical History:  Procedure Laterality Date   BREAST BIOPSY Left 04/30/2022   stereo biopsy/x clip. path pending   BREAST REDUCTION SURGERY Bilateral 06/20/2022   Procedure: MAMMARY REDUCTION  (BREAST);  Surgeon: Glenna Fellows, MD;  Location: [facility];  Service: Plastics;  Laterality: Bilateral;   DILATION AND CURETTAGE OF UTERUS     Family History  Problem Relation Age of Onset   Ovarian cancer Mother 61   Psoriasis Son    Eczema Son    Social History   Tobacco Use   Smoking status: Never   Smokeless tobacco: Never  Vaping Use   Vaping status: Never Used  Substance Use Topics   Alcohol use: Never   Drug use: Never   Allergies  Allergen Reactions   Sulfa Antibiotics Itching   Current Outpatient Medications on File Prior to Visit  Medication Sig Dispense Refill   Ciclopirox 1 % shampoo Apply 1 application  topically once a week. Apply to scalp let sit 5 minutes and rinse out 120 mL 6   clobetasol (OLUX) 0.05 % topical foam Apply topically as directed. Qd aa psoriasis on scalp up to 5 day per week, avoid face, groin, axilla 100 g 6   Fluocinolone Acetonide Body 0.01 % OIL Apply 1 application  topically daily. Qd to aa scalp prn flares 118 mL 2    fluticasone (FLONASE) 50 MCG/ACT nasal spray once daily as needed     hydrocortisone 2.5 % cream Apply topically 2 (two) times daily as needed (Rash). apply to affected areas on her face once a day Mon, Wednesday and Friday 30 g 11   hydrocortisone 2.5 % lotion Apply topically as directed. Apply to scaly areas on face Tuesday, Thursday and Friday at bedtime prn flares 59 mL 6   loratadine (CLARITIN) 10 MG tablet Take 10 mg by mouth daily.     meclizine (ANTIVERT) 25 MG tablet Take 25 mg by mouth 3 (three) times daily as needed.     SUMAtriptan (IMITREX) 25 MG tablet Take by mouth.     No current facility-administered medications on file prior to visit.   Review of Systems Pertinent items noted in HPI and remainder of comprehensive ROS otherwise negative.   Physical Exam:   Vitals:   11/09/23 1508  BP: 120/76  Pulse: 98  Weight: 180 lb (81.6 kg)   Fetal Heart Rate (bpm): 156   Patient informed that the ultrasound is considered a limited obstetric ultrasound and is not intended  to be a complete ultrasound exam.  Patient also informed that the ultrasound is not being completed with the intent of assessing for fetal or placental anomalies or any pelvic abnormalities.  Explained that the purpose of today's ultrasound is to assess for fetal heart rate.  Patient acknowledges the purpose of the exam and the limitations of the study. General: well-developed, well-nourished female in no acute distress  Breasts:  deferred  Skin: normal coloration and turgor, no rashes  Neurologic: oriented, normal, negative, normal mood  Extremities: normal strength, tone, and muscle mass, ROM of all joints is normal  HEENT PERRLA, extraocular movement intact and sclera clear, anicteric  Neck supple and no masses  Cardiovascular: regular rate and rhythm  Respiratory:  no respiratory distress, normal breath sounds  Abdomen: soft, non-tender; bowel sounds normal; no masses,  no organomegaly  Pelvic: normal  external genitalia, no lesions, normal vaginal mucosa, normal vaginal discharge, normal cervix, pap smear done. Exam done in the presence of a chaperone.     Assessment:    Pregnancy: G3P1011 Patient Active Problem List   Diagnosis Date Noted   GBS (group B streptococcus) in urine complicating pregnancy 11/09/2023   Supervision of high risk pregnancy, antepartum 09/30/2023   Maternal age 87+, multigravida, antepartum 09/30/2023    Plan:    1. Maternal age 46+, multigravida, antepartum Will get NIPS, will follow up results and manage accordingly. Detailed MFM scan ordered.  - [place] MFM OB DETAIL +14 WK; Future - Comprehensive metabolic panel - aspirin EC 81 MG tablet; Take 1 tablet (81 mg total) by mouth at bedtime. Start taking when you are [redacted] weeks pregnant for rest of pregnancy for prevention of preeclampsia  Dispense: 300 tablet; Refill: 2 - Inheritest 14-Gene Panel - MaterniT21 PLUS Core NO Gender  2. Vaginal discharge during pregnancy in first trimester - NuSwab VG, Candida 6sp done, will follow up results and manage accordingly.  3. [redacted] weeks gestation of pregnancy 4. Supervision of high risk pregnancy, antepartum - CBC/D/Plt+RPR+Rh+ABO+RubIgG... - Hemoglobin A1c - TSH Rfx on Abnormal to Free T4 - [place] MFM OB DETAIL +14 WK; Future - Comprehensive metabolic panel - aspirin EC 81 MG tablet; Take 1 tablet (81 mg total) by mouth at bedtime. Start taking when you are [redacted] weeks pregnant for rest of pregnancy for prevention of preeclampsia  Dispense: 300 tablet; Refill: 2 - Inheritest 14-Gene Panel - MaterniT21 PLUS Core NO Gender  Initial labs drawn. Continue prenatal vitamins. Problem list reviewed and updated. Genetic Screening discussed, NIPS, carrier screening: ordered. Ultrasound discussed; fetal anatomic survey: scheduled. Anticipatory guidance about prenatal visits given including labs, ultrasounds, and testing. Weight gain recommendations per IOM guidelines reviewed:  underweight/BMI 18.5 or less > 28 - 40 lbs; normal weight/BMI 18.5 - 24.9 > 25 - 35 lbs; overweight/BMI 25 - 29.9 > 15 - 25 lbs; obese/BMI  30 or more > 11 - 20 lbs. Discussed usage of the Babyscripts app for more information about pregnancy, and to track blood pressures. Also discussed usage of virtual visits as additional source of managing and completing prenatal visits.  Patient was encouraged to use MyChart to review results, send requests, and have questions addressed.   The nature of Center for [facility] Healthcare/Faculty Practice with multiple MDs and Advanced Practice Providers was explained to patient; also emphasized that residents, students are part of our team. Routine obstetric precautions reviewed. Encouraged to seek out care at our office or emergency room [facility] MAU preferred) for urgent and/or emergent concerns. Return in  about 4 weeks (around 12/07/2023) for OFFICE OB VISIT (MD only).     Jaynie Collins, MD, FACOG Obstetrician & Gynecologist, [facility] for [employer], [facility]

## 2023-11-10 LAB — CBC/D/PLT+RPR+RH+ABO+RUBIGG...
Antibody Screen: NEGATIVE
Basophils Absolute: 0.1 10*3/uL (ref 0.0–0.2)
Basos: 0 %
EOS (ABSOLUTE): 0.1 10*3/uL (ref 0.0–0.4)
Eos: 1 %
HCV Ab: NONREACTIVE
HIV Screen 4th Generation wRfx: NONREACTIVE
Hematocrit: 36.2 % (ref 34.0–46.6)
Hemoglobin: 12.2 g/dL (ref 11.1–15.9)
Hepatitis B Surface Ag: NEGATIVE
Immature Grans (Abs): 0 10*3/uL (ref 0.0–0.1)
Immature Granulocytes: 0 %
Lymphocytes Absolute: 3.4 10*3/uL — ABNORMAL HIGH (ref 0.7–3.1)
Lymphs: 29 %
MCH: 29.6 pg (ref 26.6–33.0)
MCHC: 33.7 g/dL (ref 31.5–35.7)
MCV: 88 fL (ref 79–97)
Monocytes Absolute: 0.6 10*3/uL (ref 0.1–0.9)
Monocytes: 5 %
Neutrophils Absolute: 7.5 10*3/uL — ABNORMAL HIGH (ref 1.4–7.0)
Neutrophils: 65 %
Platelets: 410 10*3/uL (ref 150–450)
RBC: 4.12 x10E6/uL (ref 3.77–5.28)
RDW: 13.6 % (ref 11.7–15.4)
RPR Ser Ql: NONREACTIVE
Rh Factor: POSITIVE
Rubella Antibodies, IGG: 2.17 {index} (ref >0.99)
WBC: 11.8 10*3/uL — ABNORMAL HIGH (ref 3.4–10.8)

## 2023-11-10 LAB — TSH RFX ON ABNORMAL TO FREE T4: TSH: 0.802 u[IU]/mL (ref 0.450–4.500)

## 2023-11-10 LAB — COMPREHENSIVE METABOLIC PANEL
ALT: 8 [IU]/L (ref 0–32)
AST: 15 [IU]/L (ref 0–40)
Albumin: 4.2 g/dL (ref 3.9–4.9)
Alkaline Phosphatase: 52 [IU]/L (ref 44–121)
BUN/Creatinine Ratio: 7 — ABNORMAL LOW (ref 9–23)
BUN: 5 mg/dL — ABNORMAL LOW (ref 6–24)
Bilirubin Total: 0.3 mg/dL (ref 0.0–1.2)
CO2: 20 mmol/L (ref 20–29)
Calcium: 10 mg/dL (ref 8.7–10.2)
Chloride: 101 mmol/L (ref 96–106)
Creatinine, Ser: 0.7 mg/dL (ref 0.57–1.00)
Globulin, Total: 3.1 g/dL (ref 1.5–4.5)
Glucose: 76 mg/dL (ref 70–99)
Potassium: 3.9 mmol/L (ref 3.5–5.2)
Sodium: 136 mmol/L (ref 134–144)
Total Protein: 7.3 g/dL (ref 6.0–8.5)
eGFR: 112 mL/min/{1.73_m2} (ref >59)

## 2023-11-10 LAB — HEMOGLOBIN A1C
Est. average glucose Bld gHb Est-mCnc: 114 mg/dL
Hgb A1c MFr Bld: 5.6 % (ref 4.8–5.6)

## 2023-11-10 LAB — HCV INTERPRETATION

## 2023-11-12 LAB — NUSWAB VG, CANDIDA 6SP
C PARAPSILOSIS/TROPICALIS: NEGATIVE
Candida albicans, NAA: NEGATIVE
Candida glabrata, NAA: NEGATIVE
Candida krusei, NAA: NEGATIVE
Candida lusitaniae, NAA: NEGATIVE
Trich vag by NAA: NEGATIVE

## 2023-11-19 LAB — BEACON CARRIER SCREEN;14 GENES

## 2023-11-20 ENCOUNTER — Telehealth: Payer: Self-pay

## 2023-11-20 NOTE — Telephone Encounter (Signed)
Pt calling stating that the baby's father is wanting to know the gender of the baby without her actually knowing the gender, therefore the patient has granted a one time approval for [place] to release that information to him.    Pt is advised that the results will come to her mychart along with her [facility] portal   Pt's stating that the name of the father is Dondra Prader and his phone number is [phone] Pt is wanting [place] to call him with the results

## 2023-11-22 ENCOUNTER — Encounter: Payer: Self-pay | Admitting: Obstetrics & Gynecology

## 2023-11-23 ENCOUNTER — Other Ambulatory Visit: Payer: Self-pay | Admitting: *Deleted

## 2023-11-23 LAB — MATERNIT21 GENOME NO GENDER
11q23 deletion (Jacobsen): NEGATIVE
15q11 deletion (PW Angelman): NEGATIVE
1p36 deletion syndrome: NEGATIVE
22q11 deletion (DiGeorge): NEGATIVE
4p16 deletion(Wolf-Hirschhorn): NEGATIVE
5p15 deletion (Cri-du-chat): NEGATIVE
8q24 deletion (Langer-Giedion): NEGATIVE
Fetal Fraction: 14
Gains/Losses >=7 Mb: NEGATIVE
Monosomy X (Turner Syndrome): NEGATIVE
Other autosomal aneuploidies: NEGATIVE
Result (T21): NEGATIVE
Trisomy 13 (Patau syndrome): NEGATIVE
Trisomy 18 (Edwards syndrome): NEGATIVE
Trisomy 21 (Down syndrome): NEGATIVE
XXX (Triple X Syndrome): NEGATIVE
XXY (Klinefelter Syndrome): NEGATIVE
XYY (Jacobs Syndrome): NEGATIVE

## 2023-11-23 MED ORDER — FLUTICASONE PROPIONATE 50 MCG/ACT NA SUSP: 1.0000 | Freq: Every day | NASAL | 2 refills | Status: AC

## 2023-11-26 NOTE — Telephone Encounter (Signed)
LMTRC

## 2023-12-02 NOTE — L&D Delivery Note (Addendum)
 Barbara Baker is a 43 y.o. G3P1011 s/p NSVD at [redacted]w[redacted]d. She was admitted for PPROM.   ROM: 79h 55m with clear fluid GBS Status: positive (urine) Maximum Maternal Temperature: 98.28F  Labor Progress: PPROM 04/22/24 @ 1100. Labor induced with cytotec, AROM of forebag, and pitocin.  Delivery Date/Time: 04/25/24 1839 Delivery: SNM to room. Patient is complete and feeling the urge to push. Pushed for <10 minutes. Head delivered OA. Loose nuchal cord present; baby delivered through cord. Shoulder and body delivered in usual fashion. Infant with spontaneous cry, placed on mother's abdomen, dried and stimulated. Cord clamped x 2 after 1-minute delay, and cut by patient's mother. Cord blood drawn. Placenta delivered spontaneously, intact, with 3-vessel cord. Clots removed from cervix. Fundus firm with massage and Pitocin. Labia, perineum, vagina, and cervix inspected; periurethral laceration found, hemostatic, no repair.    Placenta: intact, with accessory lobe, to L&D Complications: none Lacerations: periurethral, no repair EBL: Analgesia: epidural  Infant: female  APGARs 89  weight pending   Ida Mains, SNM 04/25/2024, 7:33 PM   The above was performed under my direct supervision and guidance.

## 2023-12-03 ENCOUNTER — Encounter: Payer: Self-pay | Admitting: *Deleted

## 2023-12-09 ENCOUNTER — Encounter: Payer: Self-pay | Admitting: Family Medicine

## 2023-12-09 ENCOUNTER — Ambulatory Visit: Payer: 59 | Admitting: Family Medicine

## 2023-12-09 VITALS — BP 122/73 | HR 90 | Wt 186.0 lb

## 2023-12-09 DIAGNOSIS — B951 Streptococcus, group B, as the cause of diseases classified elsewhere: Secondary | ICD-10-CM

## 2023-12-09 DIAGNOSIS — O0990 Supervision of high risk pregnancy, unspecified, unspecified trimester: Secondary | ICD-10-CM

## 2023-12-09 DIAGNOSIS — Z3A16 16 weeks gestation of pregnancy: Secondary | ICD-10-CM

## 2023-12-09 DIAGNOSIS — O2342 Unspecified infection of urinary tract in pregnancy, second trimester: Secondary | ICD-10-CM

## 2023-12-09 DIAGNOSIS — O09529 Supervision of elderly multigravida, unspecified trimester: Secondary | ICD-10-CM

## 2023-12-09 DIAGNOSIS — L409 Psoriasis, unspecified: Secondary | ICD-10-CM

## 2023-12-09 NOTE — Progress Notes (Signed)
 ROB   AFP : declined    CC: Weight gain

## 2023-12-09 NOTE — Progress Notes (Signed)
   PRENATAL VISIT NOTE  Subjective:  Barbara Baker is a 43 y.o. G3P1011 at [redacted]w[redacted]d being seen today for ongoing prenatal care.  She is currently monitored for the following issues for this low-risk pregnancy and has Supervision of high risk pregnancy, antepartum; Maternal age 57+, multigravida, antepartum; GBS (group B streptococcus) in urine complicating pregnancy; Allergic rhinitis; Episodic cluster headache, not intractable; Prediabetes; and Psoriasis on their problem list.  Patient reports no complaints.  Contractions: Not present. Vag. Bleeding: None.  Movement: Present. Denies leaking of fluid.   The following portions of the patient's history were reviewed and updated as appropriate: allergies, current medications, past family history, past medical history, past social history, past surgical history and problem list.   Objective:   Vitals:   12/09/23 1541  BP: 122/73  Pulse: 90  Weight: 186 lb (84.4 kg)    Fetal Status: Fetal Heart Rate (bpm): 148   Movement: Present     General:  Alert, oriented and cooperative. Patient is in no acute distress.  Skin: Skin is warm and dry. No rash noted.   Cardiovascular: Normal heart rate noted  Respiratory: Normal respiratory effort, no problems with respiration noted  Abdomen: Soft, gravid, appropriate for gestational age.  Pain/Pressure: Absent     Pelvic: Cervical exam deferred        Extremities: Normal range of motion.  Edema: None  Mental Status: Normal mood and affect. Normal behavior. Normal judgment and thought content.   Assessment and Plan:  Pregnancy: G3P1011 at [redacted]w[redacted]d 1. Group B Streptococcus urinary tract infection affecting pregnancy in second trimester (Primary) Will need treatment in labor  2. Supervision of high risk pregnancy, antepartum Continue routine prenatal care.  3. Maternal age 54+, multigravida, antepartum LR NIPT  4. Psoriasis On topical treatment options.   5. [redacted] weeks gestation of  pregnancy   Preterm labor symptoms and general obstetric precautions including but not limited to vaginal bleeding, contractions, leaking of fluid and fetal movement were reviewed in detail with the patient. Please refer to After Visit Summary for other counseling recommendations.   Return in 4 weeks (on 01/06/2024).  Future Appointments  Date Time Provider Department Center  12/23/2023  1:00 PM [facility]  01/05/2024  3:50 PM Erik Kieth BROCKS, MD [facility]  01/07/2024  4:00 PM Hester Alm BROCKS, MD [facility] None    Barbara GORMAN Birk, MD

## 2023-12-23 ENCOUNTER — Other Ambulatory Visit: Payer: Self-pay

## 2023-12-23 ENCOUNTER — Encounter: Payer: Self-pay | Admitting: Obstetrics & Gynecology

## 2023-12-23 ENCOUNTER — Ambulatory Visit: Payer: 59 | Attending: Maternal & Fetal Medicine

## 2023-12-23 ENCOUNTER — Other Ambulatory Visit: Payer: Self-pay | Admitting: Maternal & Fetal Medicine

## 2023-12-23 DIAGNOSIS — O43192 Other malformation of placenta, second trimester: Secondary | ICD-10-CM

## 2023-12-23 DIAGNOSIS — Z3A12 12 weeks gestation of pregnancy: Secondary | ICD-10-CM

## 2023-12-23 DIAGNOSIS — O09522 Supervision of elderly multigravida, second trimester: Secondary | ICD-10-CM

## 2023-12-23 DIAGNOSIS — O09529 Supervision of elderly multigravida, unspecified trimester: Secondary | ICD-10-CM | POA: Diagnosis present

## 2023-12-23 DIAGNOSIS — O43199 Other malformation of placenta, unspecified trimester: Secondary | ICD-10-CM | POA: Insufficient documentation

## 2023-12-23 DIAGNOSIS — Z3A18 18 weeks gestation of pregnancy: Secondary | ICD-10-CM | POA: Diagnosis not present

## 2024-01-05 ENCOUNTER — Ambulatory Visit: Payer: 59 | Admitting: Obstetrics and Gynecology

## 2024-01-05 VITALS — BP 109/69 | HR 103 | Wt 194.0 lb

## 2024-01-05 DIAGNOSIS — B951 Streptococcus, group B, as the cause of diseases classified elsewhere: Secondary | ICD-10-CM

## 2024-01-05 DIAGNOSIS — O2342 Unspecified infection of urinary tract in pregnancy, second trimester: Secondary | ICD-10-CM

## 2024-01-05 DIAGNOSIS — Z23 Encounter for immunization: Secondary | ICD-10-CM

## 2024-01-05 DIAGNOSIS — O09522 Supervision of elderly multigravida, second trimester: Secondary | ICD-10-CM

## 2024-01-05 DIAGNOSIS — O0990 Supervision of high risk pregnancy, unspecified, unspecified trimester: Secondary | ICD-10-CM

## 2024-01-05 DIAGNOSIS — Z3A20 20 weeks gestation of pregnancy: Secondary | ICD-10-CM | POA: Diagnosis not present

## 2024-01-05 DIAGNOSIS — R7303 Prediabetes: Secondary | ICD-10-CM

## 2024-01-05 DIAGNOSIS — O0992 Supervision of high risk pregnancy, unspecified, second trimester: Secondary | ICD-10-CM | POA: Diagnosis not present

## 2024-01-05 DIAGNOSIS — O43199 Other malformation of placenta, unspecified trimester: Secondary | ICD-10-CM

## 2024-01-05 DIAGNOSIS — O09529 Supervision of elderly multigravida, unspecified trimester: Secondary | ICD-10-CM

## 2024-01-05 NOTE — Progress Notes (Signed)
   PRENATAL VISIT NOTE  Subjective:  Barbara Baker is a 43 y.o. G3P1011 at [redacted]w[redacted]d being seen today for ongoing prenatal care.  She is currently monitored for the following issues for this low-risk pregnancy and has Supervision of high risk pregnancy, antepartum; Maternal age 79+, multigravida, antepartum; GBS (group B streptococcus) in urine complicating pregnancy; Allergic rhinitis; Episodic cluster headache, not intractable; Prediabetes; Psoriasis; and Marginal insertion of umbilical cord affecting management of mother on their problem list.  Patient reports no complaints.  Contractions: Not present. Vag. Bleeding: None.  Movement: Present. Denies leaking of fluid.   The following portions of the patient's history were reviewed and updated as appropriate: allergies, current medications, past family history, past medical history, past social history, past surgical history and problem list.   Objective:   Vitals:   01/05/24 1606  BP: 109/69  Pulse: (!) 103  Weight: 194 lb (88 kg)   Fetal Status: Fetal Heart Rate (bpm): 150   Movement: Present     General:  Alert, oriented and cooperative. Patient is in no acute distress.  Skin: Skin is warm and dry. No rash noted.   Cardiovascular: Normal heart rate noted  Respiratory: Normal respiratory effort, no problems with respiration noted  Abdomen: Soft, gravid, appropriate for gestational age.  Pain/Pressure: Absent      Assessment and Plan:  Pregnancy: G3P1011 at [redacted]w[redacted]d 1. Supervision of high risk pregnancy, antepartum (Primary) 2. [redacted] weeks gestation of pregnancy Discussed AFP, pt declines Flu shot today  3. Maternal age 74+, multigravida, antepartum LR NIPS Marginal cord insertion, otherwise normal anatomy Serial growth US , weekly antenatal testing at 36 weeks  4. Group B Streptococcus urinary tract infection affecting pregnancy in second trimester PCN in labor  5. Prediabetes Early A1c 5.6  6. Marginal insertion of umbilical  cord affecting management of mother Serial growth US   Please refer to After Visit Summary for other counseling recommendations.   Return in about 4 weeks (around 02/02/2024) for return OB at 24 weeks.  Future Appointments  Date Time Provider Department Center  02/02/2024  3:50 PM Anyanwu, Gloris LABOR, MD [facility]  02/25/2024  4:30 PM Hester Alm BROCKS, MD [facility] None  03/01/2024  8:15 AM [facility]  03/01/2024  8:35 AM Anyanwu, Gloris LABOR, MD [facility]  03/02/2024  9:00 AM [facility]  03/15/2024  3:50 PM Anyanwu, Gloris LABOR, MD [facility]  03/29/2024  3:50 PM Anyanwu, Gloris LABOR, MD [facility]   Kieth BROCKS Carolin, MD

## 2024-01-07 ENCOUNTER — Ambulatory Visit: Payer: 59 | Admitting: Dermatology

## 2024-01-13 NOTE — Progress Notes (Signed)
This encounter was created in error - please disregard.

## 2024-01-14 ENCOUNTER — Encounter: Payer: 59 | Admitting: Obstetrics and Gynecology

## 2024-01-24 ENCOUNTER — Inpatient Hospital Stay (HOSPITAL_BASED_OUTPATIENT_CLINIC_OR_DEPARTMENT_OTHER): Payer: 59

## 2024-01-24 ENCOUNTER — Encounter (HOSPITAL_COMMUNITY): Payer: Self-pay | Admitting: Obstetrics and Gynecology

## 2024-01-24 ENCOUNTER — Inpatient Hospital Stay (HOSPITAL_COMMUNITY)
Admission: AD | Admit: 2024-01-24 | Discharge: 2024-01-25 | Disposition: A | Discharge status: Home / Self Care | Payer: 59 | Attending: Obstetrics and Gynecology | Admitting: Obstetrics and Gynecology

## 2024-01-24 DIAGNOSIS — O26893 Other specified pregnancy related conditions, third trimester: Secondary | ICD-10-CM | POA: Diagnosis not present

## 2024-01-24 DIAGNOSIS — Z0371 Encounter for suspected problem with amniotic cavity and membrane ruled out: Secondary | ICD-10-CM | POA: Diagnosis not present

## 2024-01-24 DIAGNOSIS — R102 Pelvic and perineal pain: Secondary | ICD-10-CM | POA: Diagnosis not present

## 2024-01-24 DIAGNOSIS — O368120 Decreased fetal movements, second trimester, not applicable or unspecified: Secondary | ICD-10-CM | POA: Insufficient documentation

## 2024-01-24 DIAGNOSIS — O09522 Supervision of elderly multigravida, second trimester: Secondary | ICD-10-CM | POA: Insufficient documentation

## 2024-01-24 DIAGNOSIS — O26892 Other specified pregnancy related conditions, second trimester: Secondary | ICD-10-CM

## 2024-01-24 DIAGNOSIS — Z3689 Encounter for other specified antenatal screening: Secondary | ICD-10-CM | POA: Insufficient documentation

## 2024-01-24 DIAGNOSIS — Z3A23 23 weeks gestation of pregnancy: Secondary | ICD-10-CM | POA: Diagnosis not present

## 2024-01-24 DIAGNOSIS — N898 Other specified noninflammatory disorders of vagina: Secondary | ICD-10-CM | POA: Diagnosis not present

## 2024-01-24 DIAGNOSIS — N858 Other specified noninflammatory disorders of uterus: Secondary | ICD-10-CM | POA: Diagnosis not present

## 2024-01-24 LAB — URINALYSIS, ROUTINE W REFLEX MICROSCOPIC
Bilirubin Urine: NEGATIVE
Glucose, UA: NEGATIVE mg/dL
Hgb urine dipstick: NEGATIVE
Ketones, ur: NEGATIVE mg/dL
Nitrite: NEGATIVE
Protein, ur: NEGATIVE mg/dL
Specific Gravity, Urine: 1.006 (ref 1.005–1.030)
pH: 6 (ref 5.0–8.0)

## 2024-01-24 LAB — RUPTURE OF MEMBRANE (ROM)PLUS: Rom Plus: NEGATIVE

## 2024-01-24 NOTE — MAU Note (Signed)
.  Barbara Baker is a 43 y.o. at [redacted]w[redacted]d here in MAU reporting: gush of watery discharge at 2015, and since that time she has been having a lot of pelvic pressure and lower abd pain. States it feels like the baby is coming out.   Onset of complaint: 1hour ago Pain score: 3 Vitals:   01/24/24 2131  BP: 130/72  Pulse: (!) 109  Resp: 17  Temp: 98.8 F (37.1 C)  SpO2: 98%     FHT: 148  Lab orders placed from triage:

## 2024-01-25 DIAGNOSIS — Z0371 Encounter for suspected problem with amniotic cavity and membrane ruled out: Secondary | ICD-10-CM

## 2024-01-25 DIAGNOSIS — Z3A23 23 weeks gestation of pregnancy: Secondary | ICD-10-CM

## 2024-01-25 NOTE — Discharge Instructions (Signed)
 You were seen at the maternity assessment unit for the concern of leaking fluid.  The ear membranes were not ruptured on multiple test that peaked here in the maternity assessment unit.  Additionally we got an ultrasound that showed your cervix is a normal thickness and there is a normal amount of fluid around your baby.  Your baby does appear to be in a headdown position which may explain the increased pelvic pressure that you are feeling today.

## 2024-01-25 NOTE — MAU Provider Note (Signed)
 History     CSN: [id number]  Arrival date and time: 01/24/24 2108   Event Date/Time   First Provider Initiated Contact with Patient 01/24/24 2253      Chief Complaint  Patient presents with   Pelvic Pain   Decreased Fetal Movement   Pelvic Pain The patient's primary symptoms include pelvic pain. Pertinent negatives include no abdominal pain, back pain, chills, diarrhea, dysuria, fever, flank pain, nausea, rash, sore throat or vomiting.   Patient is 43 y.o. [id number] [redacted]w[redacted]d here with complaints of pelvic pressure and concerns for leaking fluid.  She reports that she was at her normal state of health when she noted a sudden gush at around 8 PM today.  She reports she has noted continued leaking of fluid occasionally since that time.  She reports normal fetal movement.  She reports no complications in this pregnancy so far.  Patient does endorse increased lower abdominal pressure today.  She also reports a cramping sensation in her lower abdomen.  +FM, denies LOF, VB, contractions, vaginal discharge.   OB History     Gravida  3   Para  1   Term  1   Preterm      AB  1   Living  1      SAB  1   IAB      Ectopic      Multiple      Live Births  1           Past Medical History:  Diagnosis Date   Dermatitis    Seborrheic dermatitis    Past Surgical History:  Procedure Laterality Date   BREAST BIOPSY Left 04/30/2022   stereo biopsy/x clip. path pending   BREAST REDUCTION SURGERY Bilateral 06/20/2022   Procedure: MAMMARY REDUCTION  (BREAST);  Surgeon: Glenna Fellows, MD;  Location: [facility];  Service: Plastics;  Laterality: Bilateral;   DILATION AND CURETTAGE OF UTERUS      Family History  Problem Relation Age of Onset   Ovarian cancer Mother 55   Psoriasis Son    Eczema Son     Social History   Tobacco Use   Smoking status: Never   Smokeless tobacco: Never  Vaping Use   Vaping status: Never Used  Substance Use Topics    Alcohol use: Never   Drug use: Never    Allergies:  Allergies  Allergen Reactions   Sulfa Antibiotics Itching    Medications Prior to Admission  Medication Sig Dispense Refill Last Dose/Taking   aspirin EC 81 MG tablet Take 1 tablet (81 mg total) by mouth at bedtime. Start taking when you are [redacted] weeks pregnant for rest of pregnancy for prevention of preeclampsia 300 tablet 2 01/23/2024   loratadine (CLARITIN) 10 MG tablet Take 10 mg by mouth daily.   01/23/2024   Ciclopirox 1 % shampoo Apply 1 application  topically once a week. Apply to scalp let sit 5 minutes and rinse out 120 mL 6    clobetasol (OLUX) 0.05 % topical foam Apply topically as directed. Qd aa psoriasis on scalp up to 5 day per week, avoid face, groin, axilla 100 g 6    Fluocinolone Acetonide Body 0.01 % OIL Apply 1 application  topically daily. Qd to aa scalp prn flares 118 mL 2    fluticasone (FLONASE) 50 MCG/ACT nasal spray Place 1 spray into both nostrils daily. 9.9 mL 2    hydrocortisone 2.5 % cream Apply topically 2 (two) times daily  as needed (Rash). apply to affected areas on her face once a day Mon, Wednesday and Friday 30 g 11    hydrocortisone 2.5 % lotion Apply topically as directed. Apply to scaly areas on face Tuesday, Thursday and Friday at bedtime prn flares 59 mL 6    meclizine (ANTIVERT) 25 MG tablet Take 25 mg by mouth 3 (three) times daily as needed.   Unknown   SUMAtriptan (IMITREX) 25 MG tablet Take by mouth.       Review of Systems  Constitutional:  Negative for chills and fever.  HENT:  Negative for congestion and sore throat.   Eyes:  Negative for pain and visual disturbance.  Respiratory:  Negative for cough, chest tightness and shortness of breath.   Cardiovascular:  Negative for chest pain.  Gastrointestinal:  Negative for abdominal pain, diarrhea, nausea and vomiting.  Endocrine: Negative for cold intolerance and heat intolerance.  Genitourinary:  Positive for pelvic pain. Negative for dysuria  and flank pain.  Musculoskeletal:  Negative for back pain.  Skin:  Negative for rash.  Allergic/Immunologic: Negative for food allergies.  Neurological:  Negative for dizziness and light-headedness.  Psychiatric/Behavioral:  Negative for agitation.    Physical Exam   Blood pressure 130/72, pulse (!) 109, temperature 98.8 F (37.1 C), temperature source Oral, resp. rate 17, height 5' 6.5" (1.689 m), weight 91.1 kg, last menstrual period 08/14/2023, SpO2 98%.  Physical Exam Vitals and nursing note reviewed.  Constitutional:      General: She is not in acute distress.    Appearance: She is well-developed.     Comments: Pregnant female  HENT:     Head: Normocephalic and atraumatic.  Eyes:     General: No scleral icterus.    Conjunctiva/sclera: Conjunctivae normal.  Cardiovascular:     Rate and Rhythm: Normal rate.  Pulmonary:     Effort: Pulmonary effort is normal.  Chest:     Chest wall: No tenderness.  Abdominal:     Palpations: Abdomen is soft.     Tenderness: There is no abdominal tenderness. There is no guarding or rebound.     Comments: Gravid  Genitourinary:    Vagina: Normal.  Musculoskeletal:        General: Normal range of motion.     Cervical back: Normal range of motion and neck supple.  Skin:    General: Skin is warm and dry.     Findings: No rash.  Neurological:     Mental Status: She is alert and oriented to person, place, and time.   Dilation: Fingertip (external os is 1cm, internal is FT) Effacement (%): 10 Cervical Position: Anterior Station: Ballotable Presentation: Undeterminable Exam by:: Lyndel Safe   MAU Course  Procedures I reviewed the patient's fetal monitoring.  Baseline HR: 145 Variability:  moderate Accels:present Decels: none A/P: Reactive NST  Reassured regarding fetal status.     MDM- HIGH  Patient was evaluated in ER setting given her acute presentation and concern for rupture membranes.  Patient here the ER showed  that her cervix was overall closed, AFI was normal, ROM plus was negative.  Does not appear that the patient has had preterm rupture of membranes.  Orders Placed This Encounter  Procedures   [place] MFM OB LIMITED   Urinalysis, Routine w reflex microscopic -Urine, Clean Catch   Rupture of Membrane (ROM) Plus   Results for orders placed or performed during the hospital encounter of 01/24/24 (from the past 24 hours)  Urinalysis, Routine w  reflex microscopic -Urine, Clean Catch     Status: Abnormal   Collection Time: 01/24/24  9:31 PM  Result Value Ref Range   Color, Urine YELLOW YELLOW   APPearance CLEAR CLEAR   Specific Gravity, Urine 1.006 1.005 - 1.030   pH 6.0 5.0 - 8.0   Glucose, UA NEGATIVE NEGATIVE mg/dL   Hgb urine dipstick NEGATIVE NEGATIVE   Bilirubin Urine NEGATIVE NEGATIVE   Ketones, ur NEGATIVE NEGATIVE mg/dL   Protein, ur NEGATIVE NEGATIVE mg/dL   Nitrite NEGATIVE NEGATIVE   Leukocytes,Ua TRACE (A) NEGATIVE   RBC / HPF 0-5 0 - 5 RBC/hpf   WBC, UA 0-5 0 - 5 WBC/hpf   Bacteria, UA RARE (A) NONE SEEN   Squamous Epithelial / HPF 0-5 0 - 5 /HPF  Rupture of Membrane (ROM) Plus     Status: None   Collection Time: 01/24/24 11:10 PM  Result Value Ref Range   Rom Plus NEGATIVE     Reviewed results of workup here with patient which were overall reassuring.  Assessment and Plan   1. Encounter for suspected premature rupture of amniotic membranes, with rupture of membranes not found   2. Uterine irritability   3. [redacted] weeks gestation of pregnancy    - Work up for ROM negative - Return precautions reviewed - Patient reassured and discharged  Federico Flake 01/25/2024, 12:10 AM

## 2024-02-02 ENCOUNTER — Ambulatory Visit (INDEPENDENT_AMBULATORY_CARE_PROVIDER_SITE_OTHER): Payer: 59 | Admitting: Obstetrics & Gynecology

## 2024-02-02 VITALS — BP 122/77 | HR 108 | Wt 205.4 lb

## 2024-02-02 DIAGNOSIS — Z3A24 24 weeks gestation of pregnancy: Secondary | ICD-10-CM

## 2024-02-02 DIAGNOSIS — O0990 Supervision of high risk pregnancy, unspecified, unspecified trimester: Secondary | ICD-10-CM

## 2024-02-02 DIAGNOSIS — O0992 Supervision of high risk pregnancy, unspecified, second trimester: Secondary | ICD-10-CM

## 2024-02-02 DIAGNOSIS — O09529 Supervision of elderly multigravida, unspecified trimester: Secondary | ICD-10-CM

## 2024-02-02 DIAGNOSIS — O09522 Supervision of elderly multigravida, second trimester: Secondary | ICD-10-CM

## 2024-02-02 NOTE — Patient Instructions (Signed)
 Oral Glucose Tolerance Test During Pregnancy Why am I having this test? The oral glucose tolerance test (GTT) is done to check how your body processes blood sugar (glucose). This is one of several tests used to diagnose diabetes that develops during pregnancy (gestational diabetes mellitus). Gestational diabetes is a short-term form of diabetes that some women develop while they are pregnant. It usually occurs during the second or third trimester of pregnancy and goes away after delivery. Testing, or screening, for gestational diabetes usually occurs around 69 of pregnancy. This test may also be needed earlier if: You have a history of gestational diabetes. There is a history of giving birth to very large babies or of losing pregnancies (having stillbirths). You have signs and symptoms of diabetes, such as: Changes in your eyesight. Tingling or numbness in your hands or feet. Changes in hunger, thirst, and urination, and these are not explained by your pregnancy. What is being tested? This test measures the amount of glucose in your blood at different times during a period of 2 hours. This shows how well your body can process glucose.  You will have three separate blood draws. What kind of sample is taken?  Blood samples are required for this test. They are usually collected by inserting a needle into a blood vessel. How do I prepare for this test? For 3 days before your test, eat normally. Have plenty of carbohydrate-rich foods. You will be asked not to eat or drink anything other than water (to fast) starting 8-10 hours before the test. Tell a health care provider about: All medicines you are taking, including vitamins, herbs, eye drops, creams, and over-the-counter medicines. Any blood disorders you have. Any surgeries you have had. Any medical conditions you have. What happens during the test? First, your blood glucose will be measured. This is referred to as your fasting blood glucose  because you fasted before the test. Then, you will drink a glucose solution that contains a certain amount of glucose. Your blood glucose will be measured again 1 and 2 hours after you drink the solution. This test takes about 2 hours to complete. You will need to stay at the testing location during this time. During the testing period: Do not eat or drink anything other than the glucose solution. Do not exercise. Do not use any products that contain nicotine or tobacco, such as cigarettes, e-cigarettes, and chewing tobacco. These can affect your test results. If you need help quitting, ask your health care provider. The testing procedure may vary among health care providers and hospitals. How are the results reported? Your results will be reported as milligrams of glucose per deciliter of blood (mg/dL) or millimoles per liter (mmol/L). There is more than one source for screening and diagnosis reference values used to diagnose gestational diabetes. Your health care provider will compare your results to normal values that were established after testing a large group of people (reference values). Reference values may vary among labs and hospitals. For this test, reference values are: Fasting: 92 mg/dL 1 hour: 161 mg/dL  2 hour: 096 mg/dL   What do the results mean? Results below the reference values are considered normal. If one or more of your blood glucose levels are at or above the reference values, you will be diagnosed with gestational diabetes.  Talk with your health care provider about what your results mean. Questions to ask your health care provider Ask your health care provider, or the department that is doing the test: When  will my results be ready? How will I get my results? What are my treatment options? What other tests do I need? What are my next steps? Summary The oral glucose tolerance test (GTT) is one of several tests used to diagnose diabetes that develops during pregnancy  (gestational diabetes mellitus). Gestational diabetes is a short-term form of diabetes that some women develop while they are pregnant. You may also have this test if you have any symptoms or risk factors for this type of diabetes. Talk with your health care provider about what your results mean. This information is not intended to replace advice given to you by your health care provider. Make sure you discuss any questions you have with your health care provider.  TDaP Vaccine Pregnancy Get the Whooping Cough Vaccine While You Are Pregnant (CDC)  It is important for women to get the whooping cough vaccine in the third trimester of each pregnancy. Vaccines are the best way to prevent this disease. There are 2 different whooping cough vaccines. Both vaccines combine protection against whooping cough, tetanus and diphtheria, but they are for different age groups: Tdap: for everyone 11 years or older, including pregnant women  DTaP: for children 2 months through 45 years of age  You need the whooping cough vaccine during each of your pregnancies The recommended time to get the shot is during your 27th through 36th week of pregnancy, preferably during the earlier part of this time period. The Centers for Disease Control and Prevention (CDC) recommends that pregnant women receive the whooping cough vaccine for adolescents and adults (called Tdap vaccine) during the third trimester of each pregnancy. The recommended time to get the shot is during your 27th through 36th week of pregnancy, preferably during the earlier part of this time period. This replaces the original recommendation that pregnant women get the vaccine only if they had not previously received it. The [employer] and the [employer] support this recommendation.  You should get the whooping cough vaccine while pregnant to pass protection to your baby frame support disabled  and/or not supported in this browser  Learn why Barbara Baker decided to get the whooping cough vaccine in her 3rd trimester of pregnancy and how her baby girl was born with some protection against the disease. Also available on YouTube. After receiving the whooping cough vaccine, your body will create protective antibodies (proteins produced by the body to fight off diseases) and pass some of them to your baby before birth. These antibodies provide your baby some short-term protection against whooping cough in early life. These antibodies can also protect your baby from some of the more serious complications that come along with whooping cough. Your protective antibodies are at their highest about 2 weeks after getting the vaccine, but it takes time to pass them to your baby. So the preferred time to get the whooping cough vaccine is early in your third trimester. The amount of whooping cough antibodies in your body decreases over time. That is why CDC recommends you get a whooping cough vaccine during each pregnancy. Doing so allows each of your babies to get the greatest number of protective antibodies from you. This means each of your babies will get the best protection possible against this disease.  Getting the whooping cough vaccine while pregnant is better than getting the vaccine after you give birth Whooping cough vaccination during pregnancy is ideal so your baby will have short-term protection as soon as he  is born. This early protection is important because your baby will not start getting his whooping cough vaccines until he is 2 months old. These first few months of life are when your baby is at greatest risk for catching whooping cough. This is also when he's at greatest risk for having severe, potentially life-threating complications from the infection. To avoid that gap in protection, it is best to get a whooping cough vaccine during pregnancy. You will then pass protection to your baby before he  is born. To continue protecting your baby, he should get whooping cough vaccines starting at 2 months old. You may never have gotten the Tdap vaccine before and did not get it during this pregnancy. If so, you should make sure to get the vaccine immediately after you give birth, before leaving the hospital or birthing center. It will take about 2 weeks before your body develops protection (antibodies) in response to the vaccine. Once you have protection from the vaccine, you are less likely to give whooping cough to your newborn while caring for him. But remember, your baby will still be at risk for catching whooping cough from others. A recent study looked to see how effective Tdap was at preventing whooping cough in babies whose mothers got the vaccine while pregnant or in the hospital after giving birth. The study found that getting Tdap between 27 through 36 weeks of pregnancy is 85% more effective at preventing whooping cough in babies younger than 2 months old. Blood tests cannot tell if you need a whooping cough vaccine There are no blood tests that can tell you if you have enough antibodies in your body to protect yourself or your baby against whooping cough. Even if you have been sick with whooping cough in the past or previously received the vaccine, you still should get the vaccine during each pregnancy. Breastfeeding may pass some protective antibodies onto your baby By breastfeeding, you may pass some antibodies you have made in response to the vaccine to your baby. When you get a whooping cough vaccine during your pregnancy, you will have antibodies in your breast milk that you can share with your baby as soon as your milk comes in. However, your baby will not get protective antibodies immediately if you wait to get the whooping cough vaccine until after delivering your baby. This is because it takes about 2 weeks for your body to create antibodies. Learn more about the health benefits of  breastfeeding.

## 2024-02-02 NOTE — Progress Notes (Signed)
 PRENATAL VISIT NOTE  Subjective:  Barbara Baker is a 43 y.o. G3P1011 at [redacted]w[redacted]d being seen today for ongoing prenatal care.  She is currently monitored for the following issues for this high-risk pregnancy and has Supervision of high risk pregnancy, antepartum; Maternal age 53+, multigravida, antepartum; GBS (group B streptococcus) in urine complicating pregnancy; Allergic rhinitis; Episodic cluster headache, not intractable; Prediabetes; Psoriasis; and Marginal insertion of umbilical cord affecting management of mother on their problem list.  Patient reports no complaints.  Contractions: Irregular. Vag. Bleeding: None.  Movement: Present. Denies leaking of fluid.   The following portions of the patient's history were reviewed and updated as appropriate: allergies, current medications, past family history, past medical history, past social history, past surgical history and problem list.   Objective:   Vitals:   02/02/24 1557  BP: 122/77  Pulse: (!) 108  Weight: 205 lb 6.4 oz (93.2 kg)    Fetal Status: Fetal Heart Rate (bpm): 136   Movement: Present     General:  Alert, oriented and cooperative. Patient is in no acute distress.  Skin: Skin is warm and dry. No rash noted.   Cardiovascular: Normal heart rate noted  Respiratory: Normal respiratory effort, no problems with respiration noted  Abdomen: Soft, gravid, appropriate for gestational age.  Pain/Pressure: Present     Pelvic: Cervical exam deferred        Extremities: Normal range of motion.  Edema: Trace  Mental Status: Normal mood and affect. Normal behavior. Normal judgment and thought content.   [place] [facility] OB LIMITED Result Date: 01/25/2024 ----------------------------------------------------------------------  OBSTETRICS REPORT                       (Signed Final 01/25/2024 04:08 pm) ---------------------------------------------------------------------- Patient Info  ID #:       [id number]                          D.O.B.:   [birth year] (42 yrs)(F)  Name:       Barbara Baker              Visit Date: 01/24/2024 11:31 pm ---------------------------------------------------------------------- Performed By  Attending:        Braxton Feathers DO       Ref. Address:     [address]                                                             [place], [place]                                                             [address]  Performed By:     Earley Brooke     [facility]  Referred By:      Federico Flake MD ---------------------------------------------------------------------- Orders  #  Description                           Code        Ordered By  1  [place] MFM OB LIMITED                     16109.60    KIMBERLY NEWTON ----------------------------------------------------------------------  #  Order #                     Accession #                Episode #  1  [id number]                   [id number]                 [id number] ---------------------------------------------------------------------- Indications  Vaginal discharge during pregnancy in third    O26.893  trimester  Pelvic pain affecting pregnancy in second      O26.892  trimester  Encounter for cervical length                  Z36.86  [redacted] weeks gestation of pregnancy                Z3A.23 ---------------------------------------------------------------------- Fetal Evaluation  Num Of Fetuses:         1  Fetal Heart Rate(bpm):  138  Cardiac Activity:       Observed  Presentation:           Cephalic  Placenta:               Posterior Fundal  P. Cord Insertion:      Marg insertion previously seen  Amniotic Fluid  AFI FV:      Within normal limits  AFI Sum(cm)     %Tile       Largest Pocket(cm)  18.2            72          6.9  RUQ(cm)       RLQ(cm)       LUQ(cm)        LLQ(cm)  3.7           5.9           4.6            4  ---------------------------------------------------------------------- Biometry  LV:        4.6  mm ---------------------------------------------------------------------- OB History  Gravidity:    3         Term:   1        Prem:   0        SAB:   1  TOP:          0       Ectopic:  0        Living: 1 ---------------------------------------------------------------------- Gestational Age  LMP:           23w 2d        Date:  08/14/23                  EDD:   05/20/24  Best:  23w 2d     Det. By:  LMP  (08/14/23)          EDD:   05/20/24 ---------------------------------------------------------------------- Anatomy  Ventricles:            Appears normal         Kidneys:                Appear normal  Stomach:               Appears normal, left   Bladder:                Appears normal                         sided ---------------------------------------------------------------------- Cervix Uterus Adnexa  Cervix  Length:            4.6  cm.  Normal appearance by transabdominal scan  Right Ovary  Size(cm)     4.24   x   2.14   x  1.74      Vol(ml): 8.27  Within normal limits.  Left Ovary  Size(cm)     4.15   x   2.5    x  1.73      Vol(ml): 9.4  Within normal limits.  Adnexa  No abnormality visualized ---------------------------------------------------------------------- Comments  Hospital Ultrasound  The patient presented to the MAU for pelvic pressure.  Sonographic findings  Single intrauterine pregnancy at 23w 2d  Fetal cardiac activity: Observed and appears normal.  Presentation: Cephalic.  Limited fetal anatomy appears normal.  Amniotic fluid volume: Within normal limits. MVP: 6.9 cm.  Placenta: Posterior Fundal.  Recommendations  - Continue clinical management per OB provider.  This was a limited ultrasound with a remote read. If an official  MFM consult is requested for any reason please call/place an  order in Epic. ----------------------------------------------------------------------                  Braxton Feathers, DO Electronically Signed Final Report   01/25/2024 04:08 pm ----------------------------------------------------------------------    Assessment and Plan:  Pregnancy: G3P1011 at [redacted]w[redacted]d 1. Maternal age 26+, multigravida, antepartum (Primary) 2. [redacted] weeks gestation of pregnancy 3. Supervision of high risk pregnancy, antepartum Answered various questions, concerns addressed. Discussed need for GTT next visit. Preterm labor symptoms and general obstetric precautions including but not limited to vaginal bleeding, contractions, leaking of fluid and fetal movement were reviewed in detail with the patient. Please refer to After Visit Summary for other counseling recommendations.   Return in about 4 weeks (around 03/01/2024) for 2 hr GTT, 3rd trimester labs, TDap, OFFICE OB VISIT (MD only).  Future Appointments  Date Time Provider Department Center  02/25/2024  4:30 PM Deirdre Evener, MD [facility] None  03/01/2024  8:15 AM [facility]  03/01/2024  8:35 AM Brockton Mckesson, Jethro Bastos, MD [facility]  03/02/2024  9:00 AM [facility]  03/15/2024  3:50 PM Chaka Boyson, Jethro Bastos, MD [facility]  03/29/2024  3:50 PM Lilly Gasser, Jethro Bastos, MD [facility]    Jaynie Collins, MD

## 2024-02-25 ENCOUNTER — Ambulatory Visit: Payer: 59 | Admitting: Dermatology

## 2024-02-29 ENCOUNTER — Ambulatory Visit

## 2024-02-29 IMAGING — MG MM BREAST BX W LOC DEV 1ST LESION IMAGE BX SPEC STEREO GUIDE*L*
8 of 13 series · 8 of 21 positions shown · non-contrast
Comparison: Prior exams dated 04/16/2022 and 04/10/2022.
COMPARISON: Prior exams dated 04/16/2022 and 04/10/2022.

Addendum:
CLINICAL DATA: Here for stereotactic guided biopsy of an asymmetry
in the left breast.

EXAM:
LEFT BREAST STEREOTACTIC CORE NEEDLE BIOPSY

[L (1 of 8)]
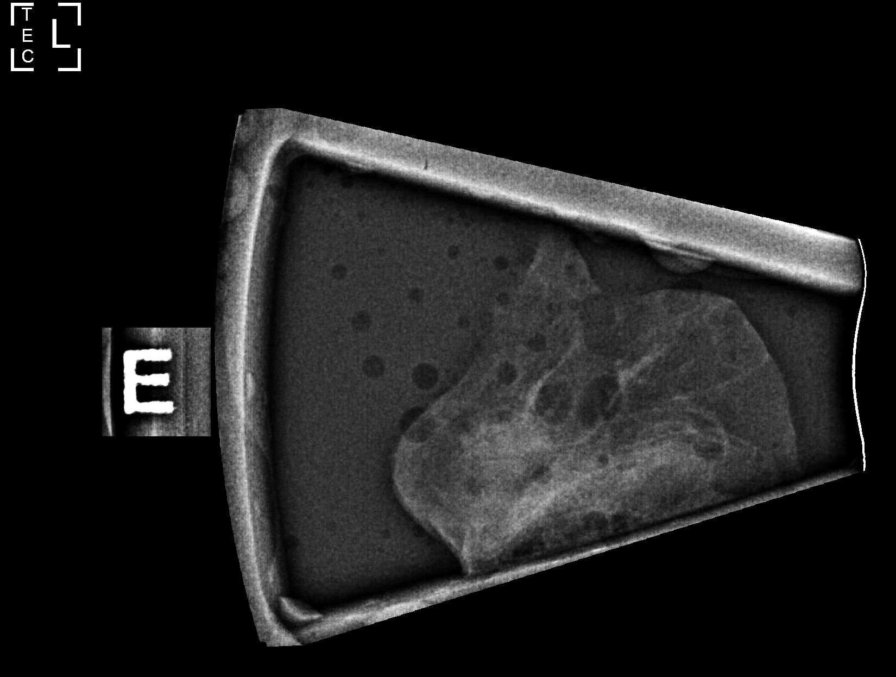

[L (2 of 8)]
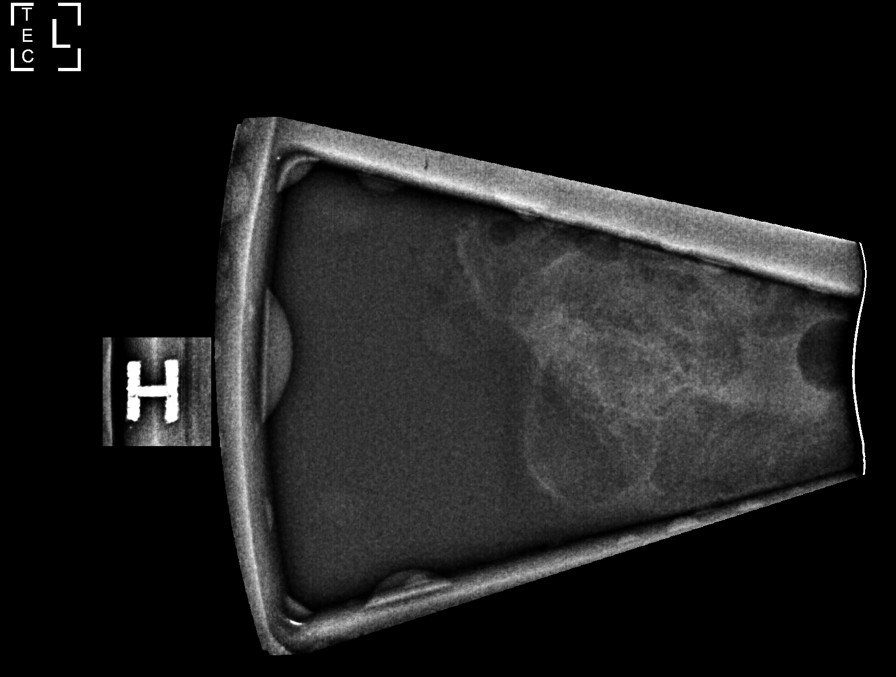

[L (3 of 8)]
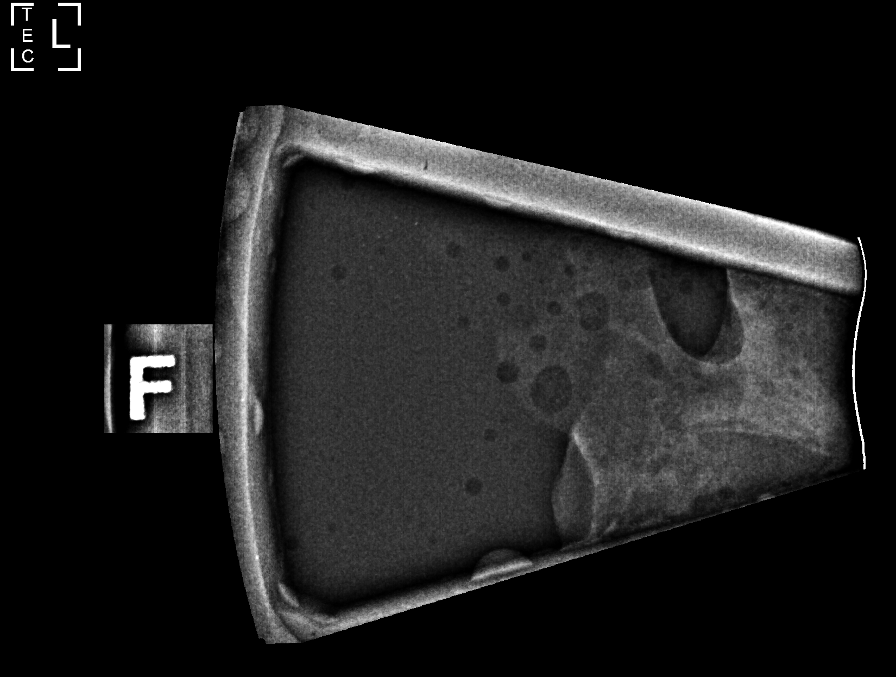

[L (4 of 8)]
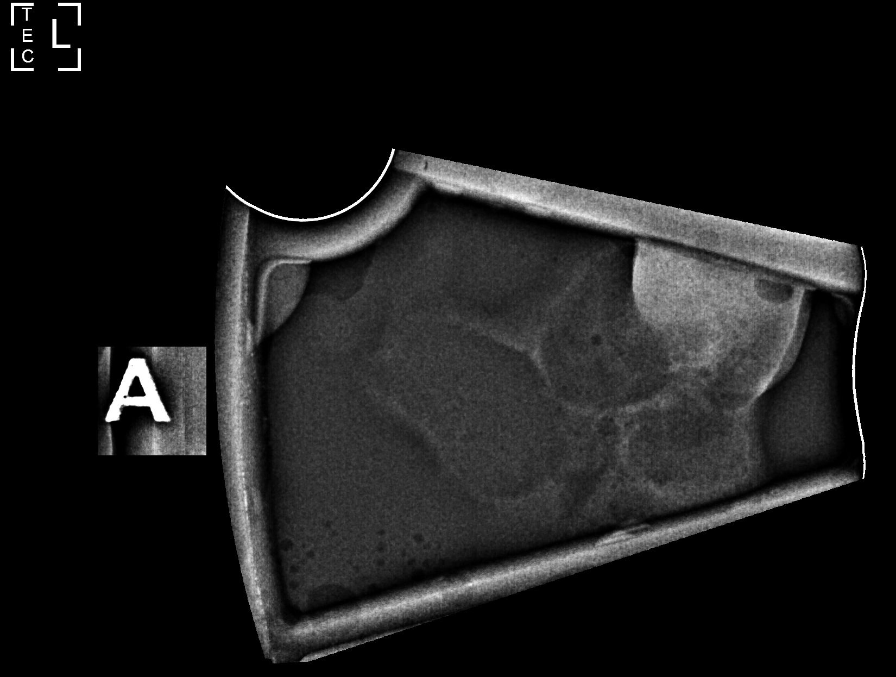

[L (5 of 8)]
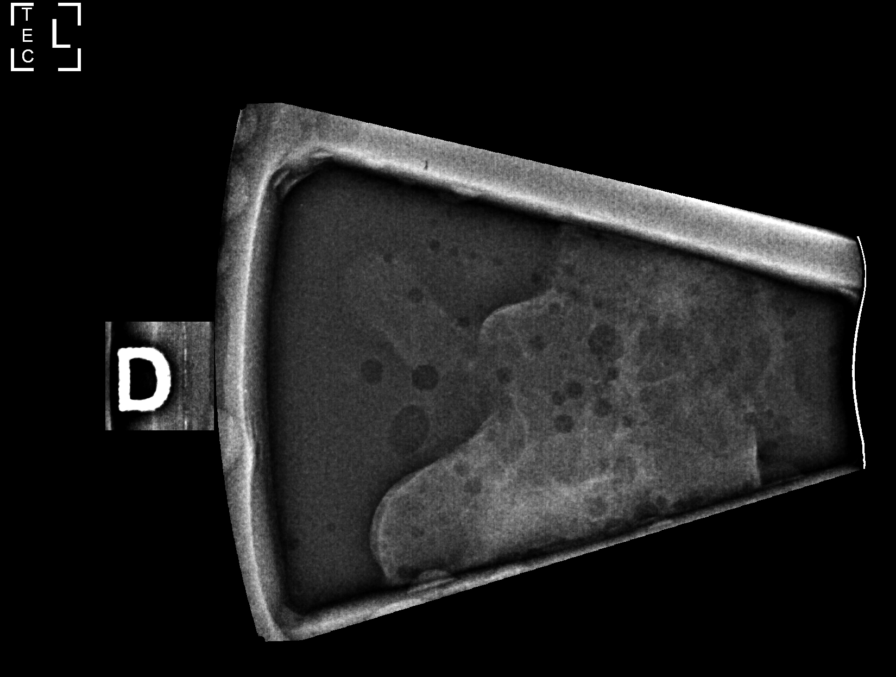

[L (6 of 8)]
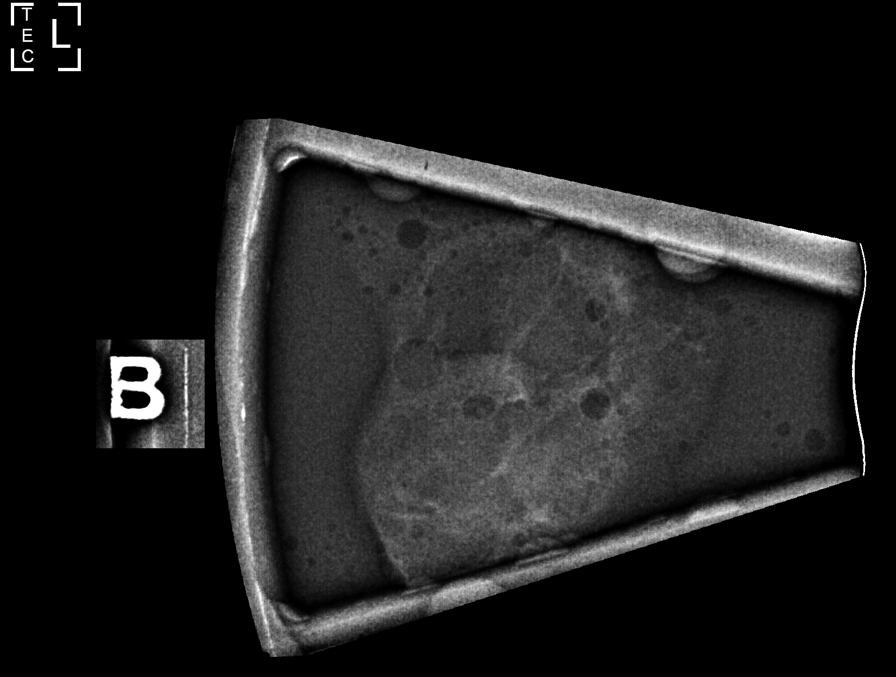

[L (7 of 8)]
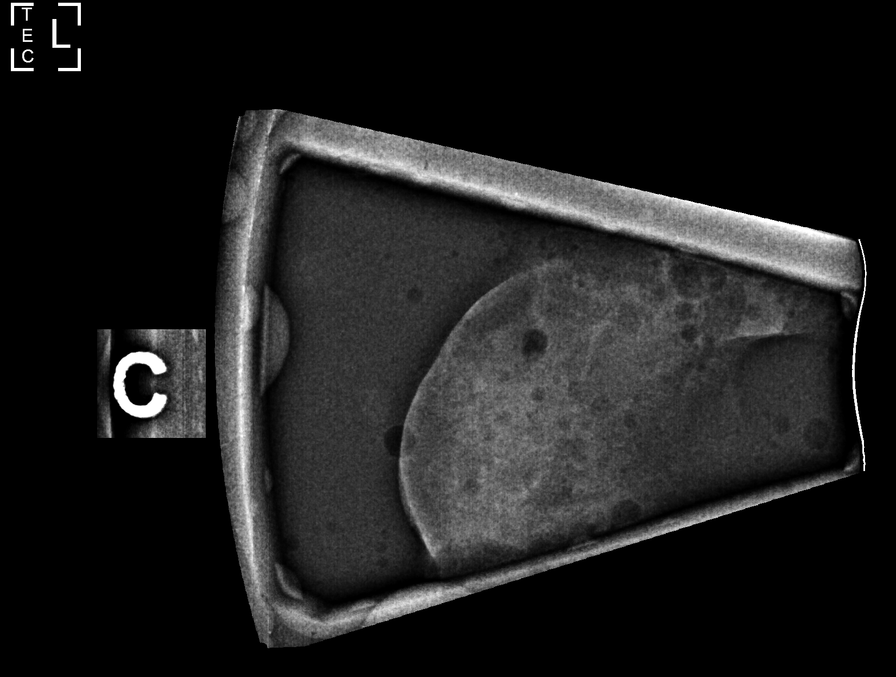

[L (8 of 8)]
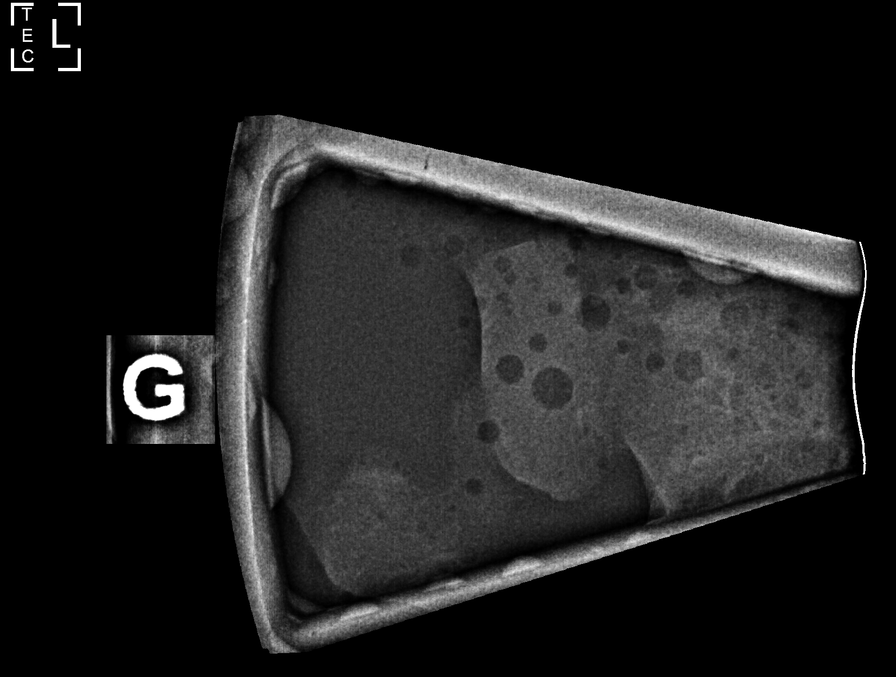

[8 of 21 positions shown; findings below may reference images not displayed]



Using sterile technique and 1% Lidocaine as local anesthetic, under
stereotactic guidance, a 9 gauge vacuum assisted device was used to
perform core needle biopsy of an asymmetry in the inner left breast
using a superior approach. Specimen radiograph was performed showing
sampling of representative tissue.

Lesion quadrant: Upper inner quadrant

At the conclusion of the procedure, an X shaped tissue marker clip
was deployed into the biopsy cavity. Follow-up 2-view mammogram was
performed and dictated separately.
IMPRESSION: Stereotactic-guided biopsy of an asymmetry in the left breast. No
apparent complications.

ADDENDUM:
Pathology revealed BENIGN BREAST TISSUE WITH FOCAL DUCT ECTASIA AND
SURROUNDING FIBROUS STROMA- NEGATIVE FOR ATYPIA AND MALIGNANCY of
the LEFT breast, upper inner quadrant (X clip). This was found to be
concordant by Dr. Yllmaz Mustafa.

Pathology results were discussed with the patient by telephone. The
patient reported doing well after the biopsy with tenderness at the
site. Post biopsy instructions and care were reviewed and questions
were answered. The patient was encouraged to call [facility]HOSPITAL[facility] for any additional
concerns.

The patient was instructed to continue with monthly self breast
examinations, clinical follow-up as needed, and to return for annual
mammography at age 42 (due April 2023). The patient was informed a
reminder notice would be sent regarding this appointment.

Pathology results reported by Aristidh Karamichali RN on 05/01/2022.



Using sterile technique and 1% Lidocaine as local anesthetic, under
stereotactic guidance, a 9 gauge vacuum assisted device was used to
perform core needle biopsy of an asymmetry in the inner left breast
using a superior approach. Specimen radiograph was performed showing
sampling of representative tissue.

Lesion quadrant: Upper inner quadrant

At the conclusion of the procedure, an X shaped tissue marker clip
was deployed into the biopsy cavity. Follow-up 2-view mammogram was
performed and dictated separately.
IMPRESSION: Stereotactic-guided biopsy of an asymmetry in the left breast. No
apparent complications.

## 2024-02-29 IMAGING — MG MM BREAST LOCALIZATION CLIP
4 series · 4 of 12 positions shown · non-contrast
Comparison: Previous exam(s).

CLINICAL DATA: Status post stereotactic guided biopsy of a focal
asymmetry in the left breast.

EXAM:
3D DIAGNOSTIC LEFT MAMMOGRAM POST STEREOTACTIC BIOPSY

[L CC synth-2D]
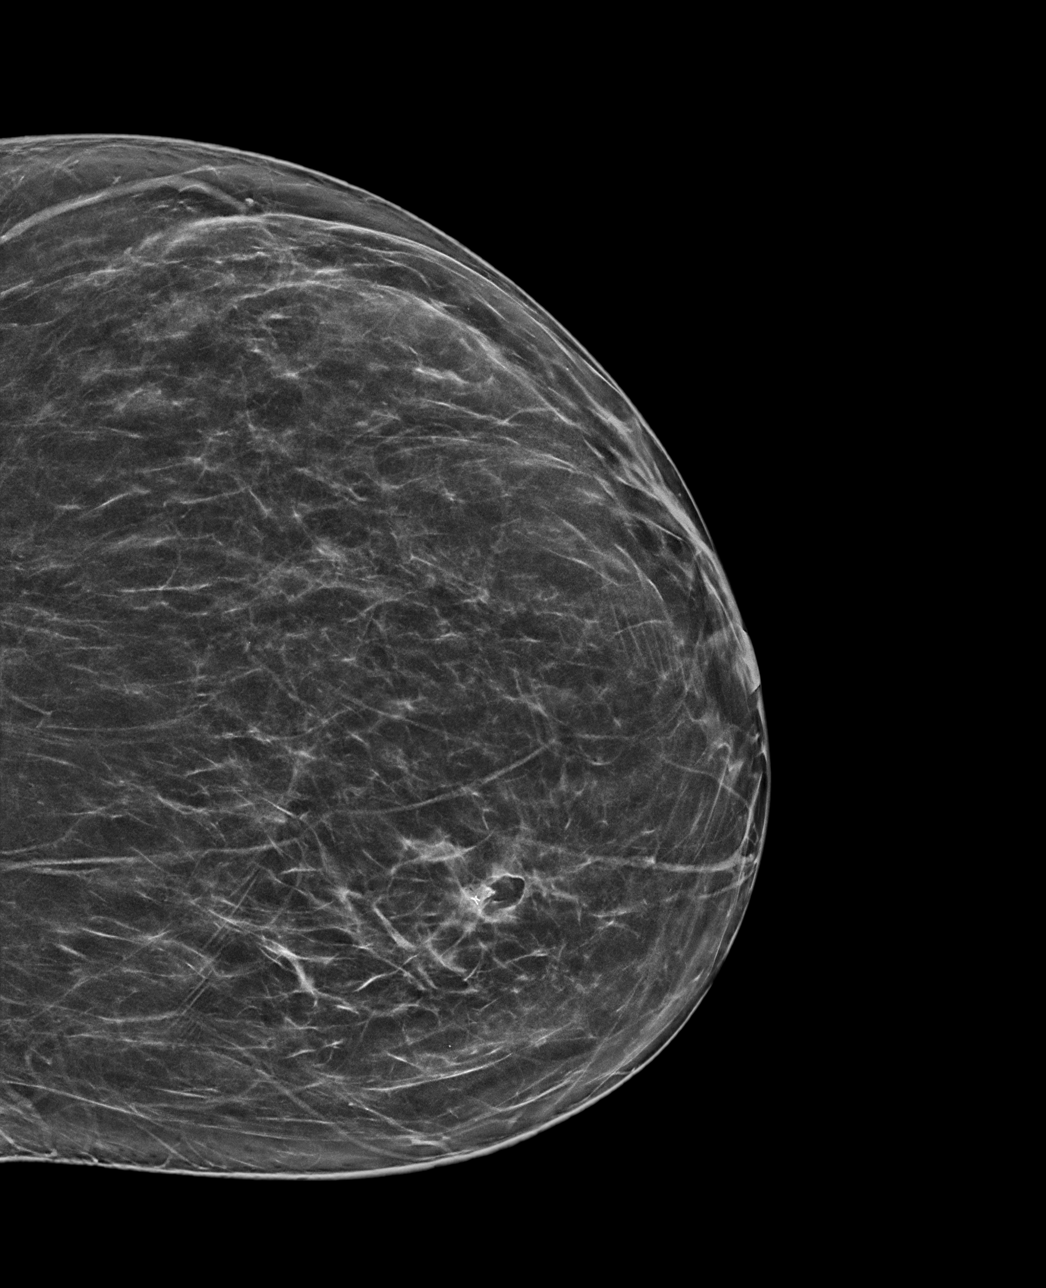

[L ML synth-2D]
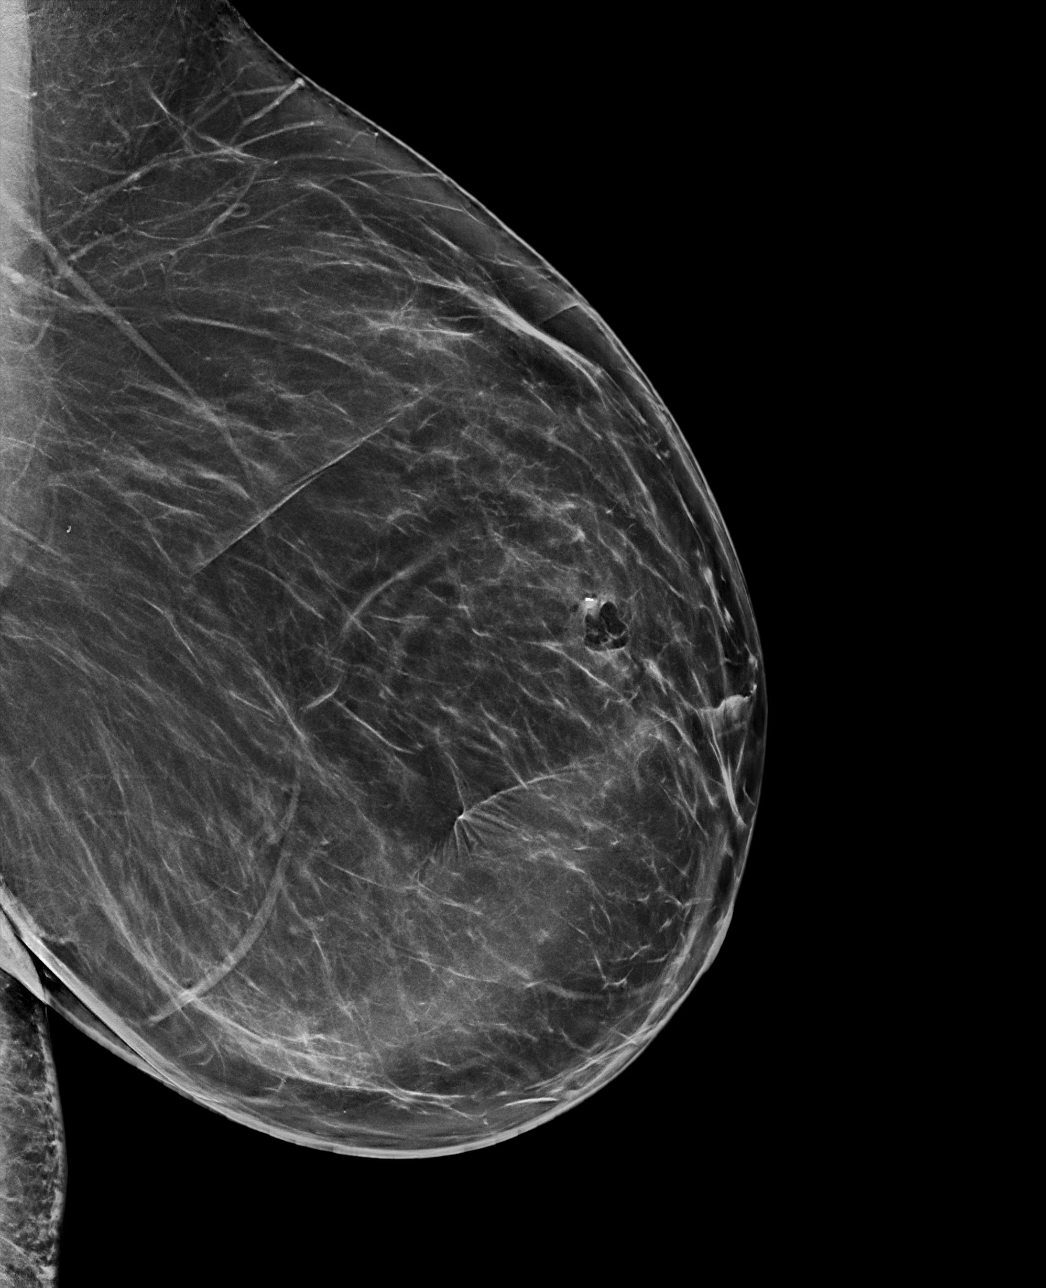

[L CC tomo · tomo slice 35/70.0]
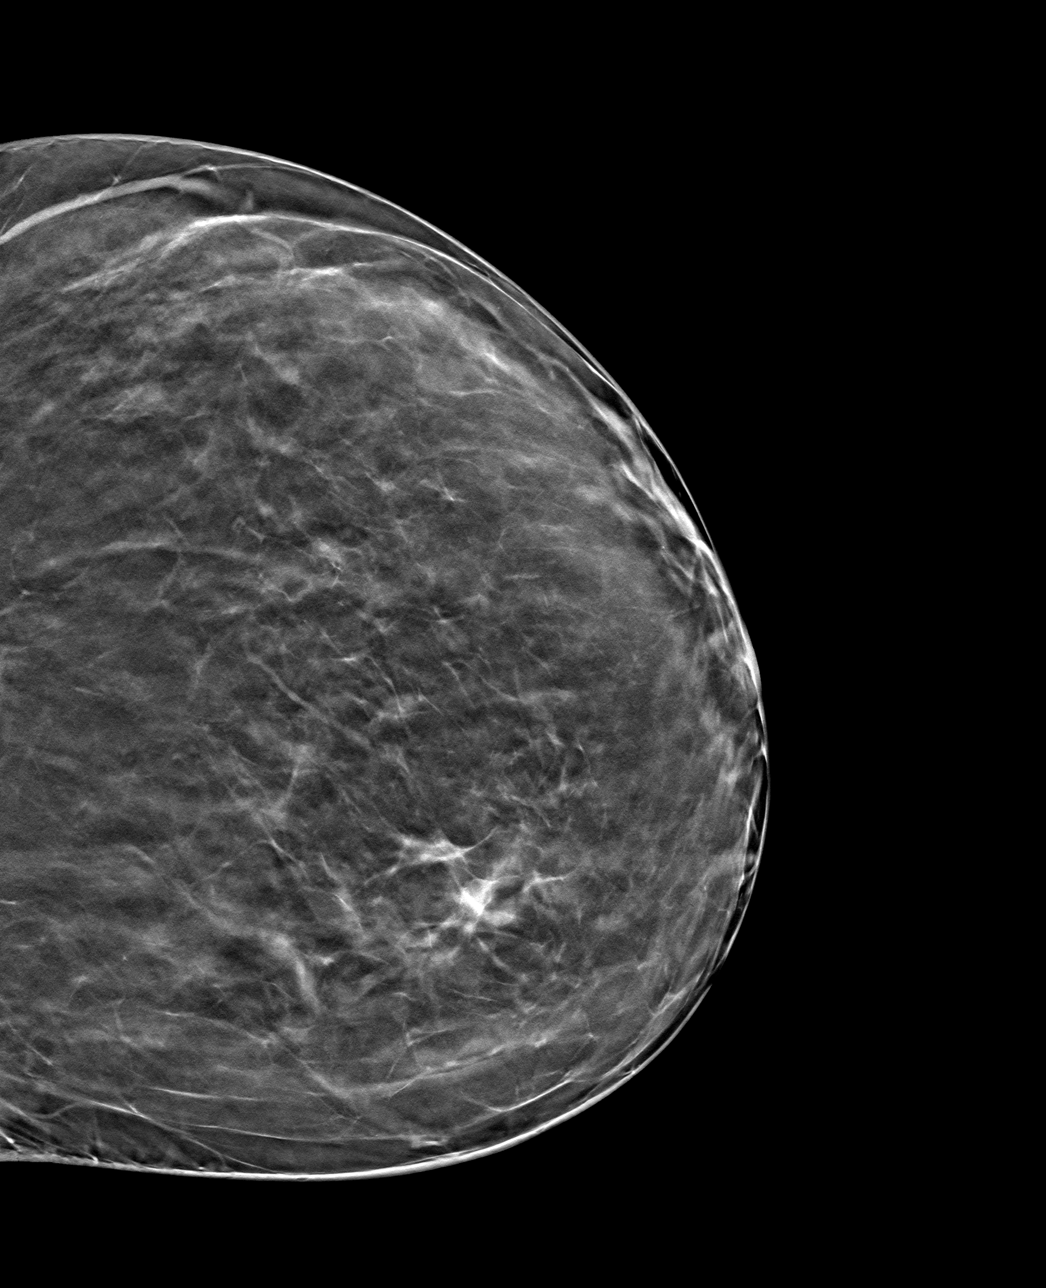

[L ML tomo · tomo slice 41/81.0]
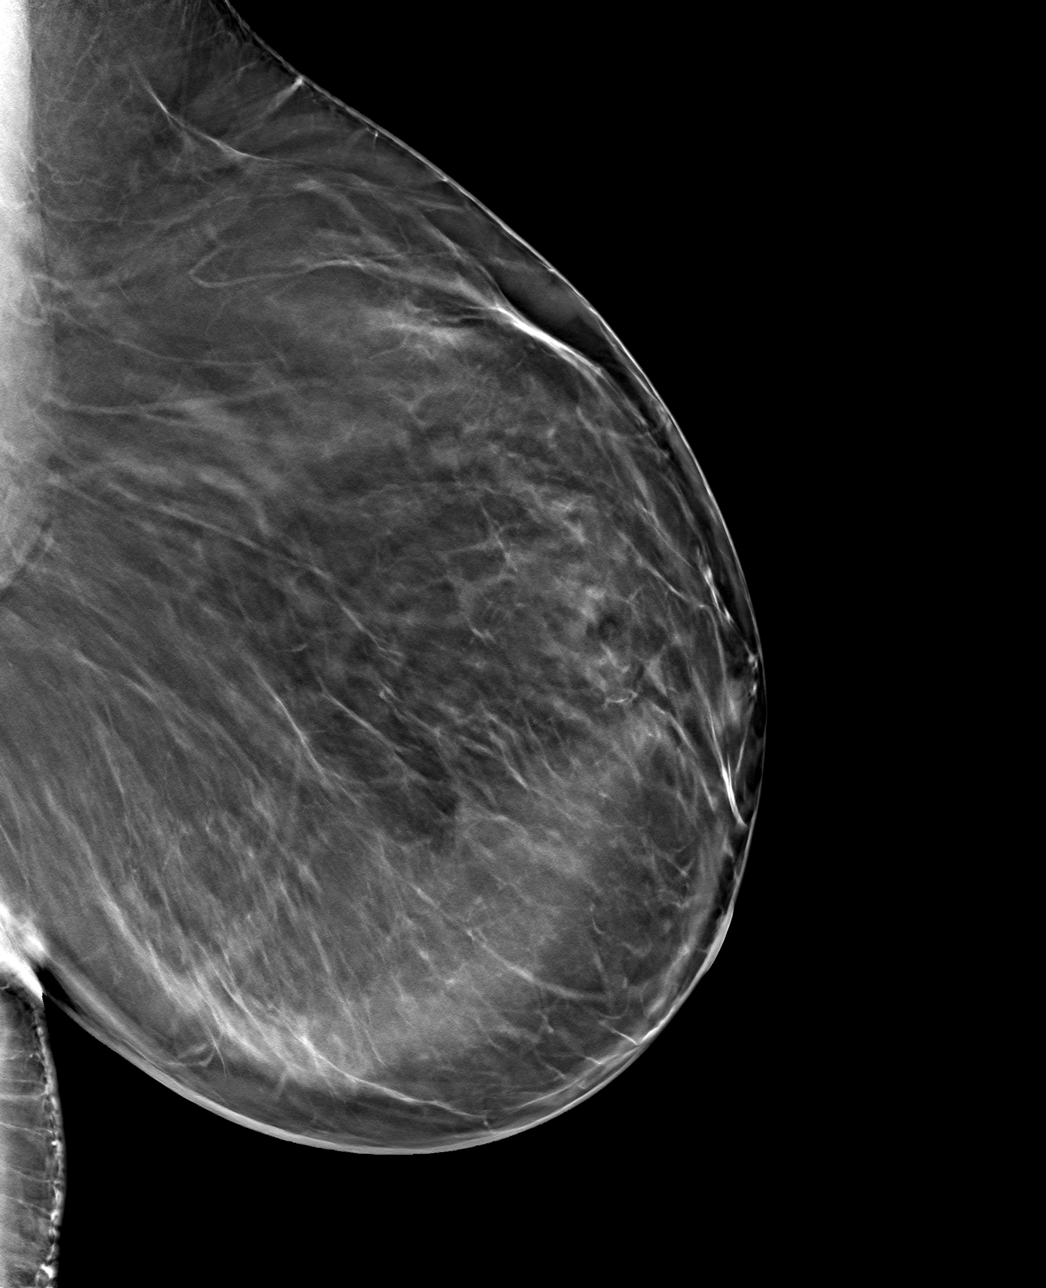

[4 of 12 positions shown; findings below may reference images not displayed]

FINDINGS: 3D Mammographic images were obtained following stereotactic guided
biopsy of a focal asymmetry in the left breast. The biopsy marking
clip is in expected position at the site of biopsy.
IMPRESSION: Appropriate positioning of the X shaped biopsy marking clip at the
site of biopsy in the left breast.

Final Assessment: Post Procedure Mammograms for Marker Placement

## 2024-03-01 ENCOUNTER — Other Ambulatory Visit: Payer: 59

## 2024-03-01 ENCOUNTER — Ambulatory Visit (INDEPENDENT_AMBULATORY_CARE_PROVIDER_SITE_OTHER): Payer: 59 | Admitting: Obstetrics & Gynecology

## 2024-03-01 VITALS — BP 115/74 | HR 109 | Wt 212.4 lb

## 2024-03-01 DIAGNOSIS — O2243 Hemorrhoids in pregnancy, third trimester: Secondary | ICD-10-CM

## 2024-03-01 DIAGNOSIS — O0990 Supervision of high risk pregnancy, unspecified, unspecified trimester: Secondary | ICD-10-CM

## 2024-03-01 DIAGNOSIS — O09529 Supervision of elderly multigravida, unspecified trimester: Secondary | ICD-10-CM

## 2024-03-01 DIAGNOSIS — Z23 Encounter for immunization: Secondary | ICD-10-CM

## 2024-03-01 DIAGNOSIS — Z3A28 28 weeks gestation of pregnancy: Secondary | ICD-10-CM

## 2024-03-01 MED ORDER — HYDROCORTISONE ACETATE 25 MG RE SUPP: 25.0000 mg | Freq: Two times a day (BID) | RECTAL | 1 refills | Status: DC

## 2024-03-01 NOTE — Patient Instructions (Signed)
Return to office for any scheduled appointments. Call the office or go to the MAU at [facility] if: You begin to have strong, frequent contractions Your water breaks.  Sometimes it is a big gush of fluid, sometimes it is just a trickle that keeps getting your underwear wet or running down your legs You have vaginal bleeding.  It is normal to have a small amount of spotting if your cervix was checked.  You do not feel your baby moving like normal.  If you do not, get something to eat and drink and lay down and focus on feeling your baby move.   If your baby is still not moving like normal, you should call the office or go to MAU. Any other obstetric concerns.   TDaP Vaccine Pregnancy Get the Whooping Cough Vaccine While You Are Pregnant (CDC)  It is important for women to get the whooping cough vaccine in the third trimester of each pregnancy. Vaccines are the best way to prevent this disease. There are 2 different whooping cough vaccines. Both vaccines combine protection against whooping cough, tetanus and diphtheria, but they are for different age groups: Tdap: for everyone 11 years or older, including pregnant women  DTaP: for children 2 months through 6 years of age  You need the whooping cough vaccine during each of your pregnancies The recommended time to get the shot is during your 27th through 36th week of pregnancy, preferably during the earlier part of this time period. The Centers for Disease Control and Prevention (CDC) recommends that pregnant women receive the whooping cough vaccine for adolescents and adults (called Tdap vaccine) during the third trimester of each pregnancy. The recommended time to get the shot is during your 27th through 36th week of pregnancy, preferably during the earlier part of this time period. This replaces the original recommendation that pregnant women get the vaccine only if they had not previously received it. The American  College of Obstetricians and Gynecologists and the American College of Nurse-Midwives support this recommendation.  You should get the whooping cough vaccine while pregnant to pass protection to your baby frame support disabled and/or not supported in this browser  Learn why Laura decided to get the whooping cough vaccine in her 3rd trimester of pregnancy and how her baby girl was born with some protection against the disease. Also available on YouTube. After receiving the whooping cough vaccine, your body will create protective antibodies (proteins produced by the body to fight off diseases) and pass some of them to your baby before birth. These antibodies provide your baby some short-term protection against whooping cough in early life. These antibodies can also protect your baby from some of the more serious complications that come along with whooping cough. Your protective antibodies are at their highest about 2 weeks after getting the vaccine, but it takes time to pass them to your baby. So the preferred time to get the whooping cough vaccine is early in your third trimester. The amount of whooping cough antibodies in your body decreases over time. That is why CDC recommends you get a whooping cough vaccine during each pregnancy. Doing so allows each of your babies to get the greatest number of protective antibodies from you. This means each of your babies will get the best protection possible against this disease.  Getting the whooping cough vaccine while pregnant is better than getting the vaccine after you give birth Whooping cough vaccination during pregnancy is ideal so   your baby will have short-term protection as soon as he is born. This early protection is important because your baby will not start getting his whooping cough vaccines until he is 2 months old. These first few months of life are when your baby is at greatest risk for catching whooping cough. This is also when he's at greatest  risk for having severe, potentially life-threating complications from the infection. To avoid that gap in protection, it is best to get a whooping cough vaccine during pregnancy. You will then pass protection to your baby before he is born. To continue protecting your baby, he should get whooping cough vaccines starting at 2 months old. You may never have gotten the Tdap vaccine before and did not get it during this pregnancy. If so, you should make sure to get the vaccine immediately after you give birth, before leaving the hospital or birthing center. It will take about 2 weeks before your body develops protection (antibodies) in response to the vaccine. Once you have protection from the vaccine, you are less likely to give whooping cough to your newborn while caring for him. But remember, your baby will still be at risk for catching whooping cough from others. A recent study looked to see how effective Tdap was at preventing whooping cough in babies whose mothers got the vaccine while pregnant or in the hospital after giving birth. The study found that getting Tdap between 27 through 36 weeks of pregnancy is 85% more effective at preventing whooping cough in babies younger than 2 months old. Blood tests cannot tell if you need a whooping cough vaccine There are no blood tests that can tell you if you have enough antibodies in your body to protect yourself or your baby against whooping cough. Even if you have been sick with whooping cough in the past or previously received the vaccine, you still should get the vaccine during each pregnancy. Breastfeeding may pass some protective antibodies onto your baby By breastfeeding, you may pass some antibodies you have made in response to the vaccine to your baby. When you get a whooping cough vaccine during your pregnancy, you will have antibodies in your breast milk that you can share with your baby as soon as your milk comes in. However, your baby will not get  protective antibodies immediately if you wait to get the whooping cough vaccine until after delivering your baby. This is because it takes about 2 weeks for your body to create antibodies. Learn more about the health benefits of breastfeeding.  

## 2024-03-01 NOTE — Progress Notes (Signed)
   PRENATAL VISIT NOTE  Subjective:  Barbara Baker is a 43 y.o. G3P1011 at [redacted]w[redacted]d being seen today for ongoing prenatal care.  She is currently monitored for the following issues for this high-risk pregnancy and has Supervision of high risk pregnancy, antepartum; Maternal age 60+, multigravida, antepartum; GBS (group B streptococcus) in urine complicating pregnancy; Allergic rhinitis; Episodic cluster headache, not intractable; Prediabetes; Psoriasis; and Marginal insertion of umbilical cord affecting management of mother on their problem list.  Patient reports  noticing a hemorrhoid that is occasionally uncomfortable .  Contractions: Irregular. Vag. Bleeding: None.  Movement: Present. Denies leaking of fluid.   The following portions of the patient's history were reviewed and updated as appropriate: allergies, current medications, past family history, past medical history, past social history, past surgical history and problem list.   Objective:   Vitals:   03/01/24 0822  BP: 115/74  Pulse: (!) 109  Weight: 212 lb 6.4 oz (96.3 kg)    Fetal Status: Fetal Heart Rate (bpm): 149   Movement: Present     General:  Alert, oriented and cooperative. Patient is in no acute distress.  Skin: Skin is warm and dry. No rash noted.   Cardiovascular: Normal heart rate noted  Respiratory: Normal respiratory effort, no problems with respiration noted  Abdomen: Soft, gravid, appropriate for gestational age.  Pain/Pressure: Present     Pelvic: No hemorrhoids noted on exam, Cervical exam deferred. Done in presence of RN as chaperone        Extremities: Normal range of motion.  Edema: None  Mental Status: Normal mood and affect. Normal behavior. Normal judgment and thought content.   Assessment and Plan:  Pregnancy: G3P1011 at [redacted]w[redacted]d 1. Hemorrhoids during pregnancy in third trimester Likely internal hemorrhoids.  Recommended stool softeners, fiber rich foods etc.  Anusol prescribed as needed. -  hydrocortisone (ANUSOL-HC) 25 MG suppository; Place 1 suppository (25 mg total) rectally 2 (two) times daily.  Dispense: 12 suppository; Refill: 1  2. Need for Tdap vaccination - Tdap vaccine greater than or equal to 7yo IM given  3. Maternal age 76+, multigravida, antepartum LR NIPS, will start weekly BPPs later this trimester. Continue growth scans as scheduled, next on on 03/02/24.  4. [redacted] weeks gestation of pregnancy 5. Supervision of high risk pregnancy, antepartum (Primary) Third trimester labs today, will follow up results and manage accordingly. Third trimester expectations reviewed and all questions answered. Preterm labor symptoms and general obstetric precautions including but not limited to vaginal bleeding, contractions, leaking of fluid and fetal movement were reviewed in detail with the patient. Please refer to After Visit Summary for other counseling recommendations.   Return in about 2 weeks (around 03/15/2024) for OFFICE OB VISIT (MD only).  Future Appointments  Date Time Provider Department Center  03/02/2024  9:00 AM [facility]  03/15/2024  3:50 PM Maanasa Aderhold, Jethro Bastos, MD [facility]  03/28/2024  4:00 PM Deirdre Evener, MD [facility] None  03/29/2024  3:50 PM Dalphine Cowie, Jethro Bastos, MD [facility]  04/12/2024  3:30 PM Normandy Bing, MD [facility]  04/26/2024  3:50 PM Izzy Doubek, Jethro Bastos, MD [facility]    Jaynie Collins, MD

## 2024-03-02 ENCOUNTER — Ambulatory Visit: Admitting: Maternal & Fetal Medicine

## 2024-03-02 ENCOUNTER — Ambulatory Visit

## 2024-03-02 ENCOUNTER — Other Ambulatory Visit: Payer: Self-pay

## 2024-03-02 ENCOUNTER — Encounter: Payer: Self-pay | Admitting: Obstetrics & Gynecology

## 2024-03-02 ENCOUNTER — Ambulatory Visit: Payer: 59 | Attending: Maternal & Fetal Medicine

## 2024-03-02 VITALS — BP 120/77 | HR 116

## 2024-03-02 DIAGNOSIS — O0990 Supervision of high risk pregnancy, unspecified, unspecified trimester: Secondary | ICD-10-CM

## 2024-03-02 DIAGNOSIS — O43192 Other malformation of placenta, second trimester: Secondary | ICD-10-CM

## 2024-03-02 DIAGNOSIS — O09523 Supervision of elderly multigravida, third trimester: Secondary | ICD-10-CM | POA: Diagnosis not present

## 2024-03-02 DIAGNOSIS — O43199 Other malformation of placenta, unspecified trimester: Secondary | ICD-10-CM

## 2024-03-02 DIAGNOSIS — O0993 Supervision of high risk pregnancy, unspecified, third trimester: Secondary | ICD-10-CM | POA: Diagnosis not present

## 2024-03-02 DIAGNOSIS — Z3A28 28 weeks gestation of pregnancy: Secondary | ICD-10-CM

## 2024-03-02 DIAGNOSIS — O09522 Supervision of elderly multigravida, second trimester: Secondary | ICD-10-CM

## 2024-03-02 DIAGNOSIS — O43193 Other malformation of placenta, third trimester: Secondary | ICD-10-CM

## 2024-03-02 DIAGNOSIS — O09529 Supervision of elderly multigravida, unspecified trimester: Secondary | ICD-10-CM

## 2024-03-02 DIAGNOSIS — K7689 Other specified diseases of liver: Secondary | ICD-10-CM

## 2024-03-02 DIAGNOSIS — O35DXX0 Maternal care for other (suspected) fetal abnormality and damage, fetal gastrointestinal anomalies, not applicable or unspecified: Secondary | ICD-10-CM

## 2024-03-02 DIAGNOSIS — R9389 Abnormal findings on diagnostic imaging of other specified body structures: Secondary | ICD-10-CM

## 2024-03-02 DIAGNOSIS — Z362 Encounter for other antenatal screening follow-up: Secondary | ICD-10-CM | POA: Insufficient documentation

## 2024-03-02 DIAGNOSIS — O358XX0 Maternal care for other (suspected) fetal abnormality and damage, not applicable or unspecified: Secondary | ICD-10-CM | POA: Diagnosis not present

## 2024-03-02 DIAGNOSIS — O359XX1 Maternal care for (suspected) fetal abnormality and damage, unspecified, fetus 1: Secondary | ICD-10-CM

## 2024-03-02 LAB — CBC
Hematocrit: 32.8 % — ABNORMAL LOW (ref 34.0–46.6)
Hemoglobin: 11.1 g/dL (ref 11.1–15.9)
MCH: 30.2 pg (ref 26.6–33.0)
MCHC: 33.8 g/dL (ref 31.5–35.7)
MCV: 89 fL (ref 79–97)
Platelets: 289 10*3/uL (ref 150–450)
RBC: 3.68 x10E6/uL — ABNORMAL LOW (ref 3.77–5.28)
RDW: 13.9 % (ref 11.7–15.4)
WBC: 9.2 10*3/uL (ref 3.4–10.8)

## 2024-03-02 LAB — GLUCOSE TOLERANCE, 2 HOURS W/ 1HR
Glucose, 1 hour: 173 mg/dL (ref 70–179)
Glucose, 2 hour: 148 mg/dL (ref 70–152)
Glucose, Fasting: 84 mg/dL (ref 70–91)

## 2024-03-02 LAB — HIV ANTIBODY (ROUTINE TESTING W REFLEX): HIV Screen 4th Generation wRfx: NONREACTIVE

## 2024-03-02 LAB — RPR: RPR Ser Ql: NONREACTIVE

## 2024-03-02 NOTE — Progress Notes (Signed)
 MFM Consultation  Ms. Barbara Baker is a 43 yo G3P1 who is 30 w 5d  with an EDD of 05/20/24.   She is seen today in follow up growth at the request of Dr. Clydie Braun due to advanced maternal age and known marginal cord insertion.  Today we observed several fetal liver calcifications that were primarily along the outer border of the liver. There were a few in the central parenchyma.  Today growth was normal with an EFW of 94%. There was good fetal movement and amniotic fluid volume.  Impression/Counseling:  I discussed today's findings in detail with review of fetal images as well.  I reviewed the association of liver calcifications with fetal infection. I reassured her that there were no direct fetal findings suggestive of a fetal infection such as fetal growth restriction, intracranial calcifications or abnormal amniotic fluid.   She also denies s/sx of infection over the last several weeks or months.   I suspect this is will be finding of uncertain clinical signficance however, we will order TORCH titers today including Parvo. Her recent RPR was normal as well as her HIV.   All questions answered. We will have her return in 4 weeks for growth.  She will intitiate weekly testing between 34-36 weeks  Barbara Gerads Unk Lightning, MD  I spent 45 minutes with > 50% in face to face consultation.

## 2024-03-03 LAB — HSV(HERPES SIMPLEX VRS) I + II AB-IGG
HSV 1 Glycoprotein G Ab, IgG: REACTIVE — AB
HSV 2 Glycoprotein G Ab, IgG: REACTIVE — AB

## 2024-03-03 LAB — PARVOVIRUS B19 ANTIBODY, IGG AND IGM
Parovirus B19 IgG Abs: 2.1 {index} — ABNORMAL HIGH (ref 0.0–0.8)
Parovirus B19 IgM Abs: 0.1 {index} (ref 0.0–0.8)

## 2024-03-03 LAB — INFECT DISEASE AB IGM REFLEX 1

## 2024-03-03 LAB — TOXOPLASMA GONDII ANTIBODY, IGG: Toxoplasma IgG Ratio: <3 [IU]/mL (ref 0.0–7.1)

## 2024-03-03 LAB — CMV ANTIBODY, IGG (EIA): CMV Ab - IgG: 8.5 U/mL — ABNORMAL HIGH (ref 0.00–0.59)

## 2024-03-03 LAB — TOXOPLASMA GONDII ANTIBODY, IGM: Toxoplasma Antibody- IgM: <3 [AU]/ml (ref 0.0–7.9)

## 2024-03-03 LAB — CMV IGM: CMV IgM: <30 [AU]/ml (ref 0.0–29.9)

## 2024-03-08 ENCOUNTER — Encounter: Payer: Self-pay | Admitting: Maternal & Fetal Medicine

## 2024-03-14 ENCOUNTER — Ambulatory Visit: Admitting: Dermatology

## 2024-03-15 ENCOUNTER — Ambulatory Visit: Payer: 59 | Admitting: Obstetrics & Gynecology

## 2024-03-15 VITALS — BP 122/76 | HR 103 | Wt 213.8 lb

## 2024-03-15 DIAGNOSIS — O43199 Other malformation of placenta, unspecified trimester: Secondary | ICD-10-CM

## 2024-03-15 DIAGNOSIS — O09529 Supervision of elderly multigravida, unspecified trimester: Secondary | ICD-10-CM | POA: Diagnosis not present

## 2024-03-15 DIAGNOSIS — Z3A30 30 weeks gestation of pregnancy: Secondary | ICD-10-CM

## 2024-03-15 DIAGNOSIS — O0990 Supervision of high risk pregnancy, unspecified, unspecified trimester: Secondary | ICD-10-CM | POA: Diagnosis not present

## 2024-03-15 NOTE — Progress Notes (Signed)
   PRENATAL VISIT NOTE  Subjective:  Barbara Baker is a 43 y.o. G3P1011 at [redacted]w[redacted]d being seen today for ongoing prenatal care.  She is currently monitored for the following issues for this high-risk pregnancy and has Supervision of high risk pregnancy, antepartum; Maternal age 37+, multigravida, antepartum; GBS (group B streptococcus) in urine complicating pregnancy; Allergic rhinitis; Episodic cluster headache, not intractable; Prediabetes; Psoriasis; and Marginal insertion of umbilical cord affecting management of mother on their problem list.  Patient is concerned about labs done at MFM recently, as part of workup for calcifications seen around fetal liver.  Negative for active infection, but IgG positive for HSV1 and HSV2, parvovirus and CMV.  Never had oral or genital HSV outbreaks. Contractions: Irregular. Vag. Bleeding: None.  Movement: Present. Denies leaking of fluid.   The following portions of the patient's history were reviewed and updated as appropriate: allergies, current medications, past family history, past medical history, past social history, past surgical history and problem list.   Objective:   Vitals:   03/15/24 1601  BP: 122/76  Pulse: (!) 103  Weight: 213 lb 12.8 oz (97 kg)    Fetal Status: Fetal Heart Rate (bpm): 141   Movement: Present     General:  Alert, oriented and cooperative. Patient is in no acute distress.  Skin: Skin is warm and dry. No rash noted.   Cardiovascular: Normal heart rate noted  Respiratory: Normal respiratory effort, no problems with respiration noted  Abdomen: Soft, gravid, appropriate for gestational age.  Pain/Pressure: Present     Pelvic: Cervical exam deferred        Extremities: Normal range of motion.  Edema: None  Mental Status: Normal mood and affect. Normal behavior. Normal judgment and thought content.   Assessment and Plan:  Pregnancy: G3P1011 at [redacted]w[redacted]d 1. Marginal insertion of umbilical cord affecting management of mother  (Primary) 2. Maternal age 8+, multigravida, antepartum 3. [redacted] weeks gestation of pregnancy 4. Supervision of high risk pregnancy, antepartum Continue scans as per MFM/ Will start antenatal testing weekly starting at 34 weeks given age 36. Discussed lab results, reassured that in absence of history of genital HSV outbreak, HSV suppression is not indicated.  Reassured about other results.   Had a long discussion about this. Preterm labor symptoms and general obstetric precautions including but not limited to vaginal bleeding, contractions, leaking of fluid and fetal movement were reviewed in detail with the patient. Please refer to After Visit Summary for other counseling recommendations.   Return in about 2 weeks (around 03/29/2024) for OFFICE OB VISIT (MD or APP).  Future Appointments  Date Time Provider Department Center  03/29/2024  3:50 PM Tereso Newcomer, MD [facility]  03/30/2024 10:00 AM [facility]  04/12/2024  3:10 PM [facility] NST [facility]  04/12/2024  3:30 PM Starr Bing, MD [facility]  04/19/2024  3:10 PM [facility] NST [facility]  04/26/2024  3:50 PM Khalib Fendley, Jethro Bastos, MD [facility]  04/27/2024  4:00 PM [facility]  05/03/2024  3:10 PM [facility] NST [facility]  05/03/2024  3:30 PM Constant, Gigi Gin, MD [facility]  05/10/2024  2:10 PM [facility] NST [facility]  05/10/2024  3:30 PM Reva Bores, MD [facility]  05/17/2024  2:10 PM [facility] NST [facility]  05/17/2024  3:30 PM Lennart Pall, MD [facility]  06/14/2024  8:30 AM Deirdre Evener, MD [facility] None    Jaynie Collins, MD

## 2024-03-27 ENCOUNTER — Inpatient Hospital Stay (HOSPITAL_COMMUNITY)
Admission: AD | Admit: 2024-03-27 | Discharge: 2024-03-27 | Disposition: A | Discharge status: Home / Self Care | Attending: Obstetrics and Gynecology | Admitting: Obstetrics and Gynecology

## 2024-03-27 ENCOUNTER — Other Ambulatory Visit: Payer: Self-pay | Admitting: Certified Nurse Midwife

## 2024-03-27 ENCOUNTER — Encounter (HOSPITAL_COMMUNITY): Payer: Self-pay | Admitting: Obstetrics and Gynecology

## 2024-03-27 ENCOUNTER — Other Ambulatory Visit: Payer: Self-pay

## 2024-03-27 ENCOUNTER — Encounter: Payer: Self-pay | Admitting: Certified Nurse Midwife

## 2024-03-27 DIAGNOSIS — O479 False labor, unspecified: Secondary | ICD-10-CM

## 2024-03-27 DIAGNOSIS — O4703 False labor before 37 completed weeks of gestation, third trimester: Secondary | ICD-10-CM | POA: Diagnosis not present

## 2024-03-27 DIAGNOSIS — Z3689 Encounter for other specified antenatal screening: Secondary | ICD-10-CM | POA: Diagnosis not present

## 2024-03-27 DIAGNOSIS — Z3A32 32 weeks gestation of pregnancy: Secondary | ICD-10-CM | POA: Diagnosis not present

## 2024-03-27 DIAGNOSIS — W19XXXA Unspecified fall, initial encounter: Secondary | ICD-10-CM | POA: Diagnosis not present

## 2024-03-27 DIAGNOSIS — B9689 Other specified bacterial agents as the cause of diseases classified elsewhere: Secondary | ICD-10-CM | POA: Insufficient documentation

## 2024-03-27 DIAGNOSIS — O23593 Infection of other part of genital tract in pregnancy, third trimester: Secondary | ICD-10-CM | POA: Diagnosis not present

## 2024-03-27 DIAGNOSIS — O9A213 Injury, poisoning and certain other consequences of external causes complicating pregnancy, third trimester: Secondary | ICD-10-CM | POA: Insufficient documentation

## 2024-03-27 DIAGNOSIS — Z3493 Encounter for supervision of normal pregnancy, unspecified, third trimester: Secondary | ICD-10-CM

## 2024-03-27 LAB — WET PREP, GENITAL
Sperm: NONE SEEN
Trich, Wet Prep: NONE SEEN
WBC, Wet Prep HPF POC: >=10 — AB (ref <10)
Yeast Wet Prep HPF POC: NONE SEEN

## 2024-03-27 LAB — URINALYSIS, ROUTINE W REFLEX MICROSCOPIC
Bilirubin Urine: NEGATIVE
Glucose, UA: NEGATIVE mg/dL
Hgb urine dipstick: NEGATIVE
Ketones, ur: NEGATIVE mg/dL
Nitrite: NEGATIVE
Protein, ur: NEGATIVE mg/dL
Specific Gravity, Urine: 1.004 — ABNORMAL LOW (ref 1.005–1.030)
pH: 7 (ref 5.0–8.0)

## 2024-03-27 MED ORDER — METRONIDAZOLE 500 MG PO TABS: 500.0000 mg | ORAL_TABLET | Freq: Two times a day (BID) | ORAL | 0 refills | Status: DC

## 2024-03-27 NOTE — MAU Note (Signed)
.  Barbara Baker is a 43 y.o. at [redacted]w[redacted]d here in MAU reporting: fall yesterday (03/26/24) at 1900 while chasing her dog. Fell on outstretched hands and R knee. Has some mild swelling and discomfort to the R knee. Does not recall hitting her stomach. This morning around 1100 when the patient woke up she had some lower abdominal cramping that resolved spontaneously and light pink spotting noted on tissue when she wiped. Has not had any active bleeding. Baby is moving well. Denies LOF, contractions or current pain. Pt. Is AB+ per report. No other complaints.    Onset of complaint: 03/26/24 at 1900 Pain score: denies Vitals:   03/27/24 1145  BP: 125/71  Pulse: (!) 102  Resp: 18  Temp: 97.7 F (36.5 C)  SpO2: 97%     FHT: 140  Lab orders placed from triage: UA

## 2024-03-27 NOTE — Progress Notes (Signed)
 Patient seen in MAU. Vaginal swabs resulted positive for BV. Prescription for Flagyl , 500mg  BID x7 days sent to preferred pharmacy.   Raford Bunk, MSN, CNM, RNC-OB Certified Nurse Midwife, [facility] 03/27/2024 3:54 PM

## 2024-03-27 NOTE — MAU Provider Note (Signed)
 Chief Complaint:  Vaginal Bleeding and Fall (03/26/24 at 1900)   HPI   None     Barbara Baker is a 43 y.o. G3P1011 at [redacted]w[redacted]d who presents to maternity admissions reporting a fall at approximately 1900 last night. She reports she fell on her hands and knees while chasing after her sister's dog. She denies impact to her abdomen. She reports that when she woke this morning she had some cramping and light pink discharge x1. She denies LOF and endorses good fetal movement. Last intercourse was last weekend.   Pregnancy Course: WNL  Past Medical History:  Diagnosis Date   Dermatitis    Seborrheic dermatitis   OB History  Gravida Para Term Preterm AB Living  3 1 1  1 1   SAB IAB Ectopic Multiple Live Births  1    1    # Outcome Date GA Lbr Len/2nd Weight Sex Type Anes PTL Lv  3 Current           2 Term 05/31/08 [redacted]w[redacted]d  3629 g M Vag-Spont EPI  LIV     Birth Comments: [facility]  1 SAB            Past Surgical History:  Procedure Laterality Date   BREAST BIOPSY Left 04/30/2022   stereo biopsy/x clip. path pending   BREAST REDUCTION SURGERY Bilateral 06/20/2022   Procedure: MAMMARY REDUCTION  (BREAST);  Surgeon: Alger Infield, MD;  Location: [facility];  Service: Plastics;  Laterality: Bilateral;   DILATION AND CURETTAGE OF UTERUS     Family History  Problem Relation Age of Onset   Ovarian cancer Mother 88   Psoriasis Son    Eczema Son    Social History   Tobacco Use   Smoking status: Never   Smokeless tobacco: Never  Vaping Use   Vaping status: Never Used  Substance Use Topics   Alcohol use: Never   Drug use: Never   Allergies  Allergen Reactions   Sulfa Antibiotics Itching   No medications prior to admission.    I have reviewed patient's Past Medical Hx, Surgical Hx, Family Hx, Social Hx, medications and allergies.   ROS  Pertinent items noted in HPI and remainder of comprehensive ROS otherwise negative.   PHYSICAL EXAM  Patient Vitals for the  past 24 hrs:  BP Temp Temp src Pulse Resp SpO2 Height Weight  03/27/24 1317 132/64 98.4 F (36.9 C) Oral (!) 102 18 99 % -- --  03/27/24 1208 125/67 -- -- (!) 107 16 100 % -- --  03/27/24 1145 125/71 97.7 F (36.5 C) Oral (!) 102 18 97 % 5' 6.5" (1.689 m) 98.9 kg    Physical Exam Vitals and nursing note reviewed.  Constitutional:      Appearance: Normal appearance.  HENT:     Head: Normocephalic.  Cardiovascular:     Rate and Rhythm: Normal rate.  Pulmonary:     Effort: Pulmonary effort is normal.  Musculoskeletal:        General: Swelling present. Normal range of motion.       Legs:     Comments: Slightly edema and ecchymosis noted to R knee.   Skin:    Capillary Refill: Capillary refill takes less than 2 seconds.  Neurological:     General: No focal deficit present.     Mental Status: She is alert and oriented to person, place, and time.  Psychiatric:        Mood and Affect: Mood normal.  Behavior: Behavior normal.        Thought Content: Thought content normal.        Judgment: Judgment normal.         Fetal Tracing: Baseline: 145 Variability: moderate Accelerations: 15x15  Decelerations: variable Toco: irregular UC with UI   Labs: Results for orders placed or performed during the hospital encounter of 03/27/24 (from the past 24 hours)  Urinalysis, Routine w reflex microscopic -Urine, Clean Catch     Status: Abnormal   Collection Time: 03/27/24 12:07 PM  Result Value Ref Range   Color, Urine STRAW (A) YELLOW   APPearance CLEAR CLEAR   Specific Gravity, Urine 1.004 (L) 1.005 - 1.030   pH 7.0 5.0 - 8.0   Glucose, UA NEGATIVE NEGATIVE mg/dL   Hgb urine dipstick NEGATIVE NEGATIVE   Bilirubin Urine NEGATIVE NEGATIVE   Ketones, ur NEGATIVE NEGATIVE mg/dL   Protein, ur NEGATIVE NEGATIVE mg/dL   Nitrite NEGATIVE NEGATIVE   Leukocytes,Ua MODERATE (A) NEGATIVE   RBC / HPF 0-5 0 - 5 RBC/hpf   WBC, UA 0-5 0 - 5 WBC/hpf   Bacteria, UA RARE (A) NONE SEEN    Squamous Epithelial / HPF 0-5 0 - 5 /HPF  Wet prep, genital     Status: Abnormal   Collection Time: 03/27/24  1:12 PM   Specimen: Vaginal  Result Value Ref Range   Yeast Wet Prep HPF POC NONE SEEN NONE SEEN   Trich, Wet Prep NONE SEEN NONE SEEN   Clue Cells Wet Prep HPF POC PRESENT (A) NONE SEEN   WBC, Wet Prep HPF POC >=10 (A) <10   Sperm NONE SEEN     Imaging:  No results found.  MDM & MAU COURSE  MDM: Reviewed records Fetal heart rate tracing.  Wet prep, GC swabs  MAU Course: Orders Placed This Encounter  Procedures   Wet prep, genital   Urinalysis, Routine w reflex microscopic -Urine, Clean Catch   Discharge patient Discharge disposition: 01-Home or Self Care; Discharge patient date: 03/27/2024   Discharge patient Discharge disposition: 01-Home or Self Care; Discharge patient date: 03/27/2024   No orders of the defined types were placed in this encounter.   ASSESSMENT   1. NST (non-stress test) reactive   2. Movement of fetus present during pregnancy in third trimester   3. [redacted] weeks gestation of pregnancy   4. Braxton Hick's contraction   5. Bacterial vaginosis    Reassuring exam, no abdominal trauma sustained in fall. Minimal discomfort noted earlier, since resolved.  BV present on wet prep.   PLAN  Discharge home in stable condition. Routine precautions advised. - Prescription for Flagyl , 500mg  BID x7 days sent to preferred pharmacy. (Separate note and orders for this.).   - Pt interested in waterbirth and has attended the class.  - Reviewed conditions in labor that will risk her out of water immersion including thick meconium or blood stained amniotic fluid, non-reassuring fetal status on monitor, excessive bleeding, hypertension, dizziness, use of IV meds, damaged equipment or staffing that does not allow for water immersion, etc.  - The attending midwife must be on the unit for water immersion to begin; pt understands this may delay the start of water  immersion. - Reminded pt that signing consent in labor at the hospital also acknowledges they will exit the tub if the attending midwife requests. - Consent given to patient for review.  Consent will be reviewed and signed at the hospital by the waterbirth provider prior to  use of the tub. - Discussed other labor support options if waterbirth becomes unavailable, including position change, freedom of movement, use of birthing ball, and/or use of hydrotherapy in the shower (dependent upon medical condition/provider discretion).    Allergies as of 03/27/2024       Reactions   Sulfa Antibiotics Itching        Medication List     STOP taking these medications    hydrocortisone  2.5 % cream   hydrocortisone  2.5 % lotion   hydrocortisone  25 MG suppository Commonly known as: ANUSOL -HC   SUMAtriptan 25 MG tablet Commonly known as: IMITREX       TAKE these medications    aspirin  EC 81 MG tablet Take 1 tablet (81 mg total) by mouth at bedtime. Start taking when you are [redacted] weeks pregnant for rest of pregnancy for prevention of preeclampsia   Ciclopirox  1 % shampoo Apply 1 application  topically once a week. Apply to scalp let sit 5 minutes and rinse out   clobetasol  0.05 % topical foam Commonly known as: OLUX  Apply topically as directed. Qd aa psoriasis on scalp up to 5 day per week, avoid face, groin, axilla   Fluocinolone  Acetonide Body 0.01 % Oil Apply 1 application  topically daily. Qd to aa scalp prn flares   fluticasone  50 MCG/ACT nasal spray Commonly known as: FLONASE  Place 1 spray into both nostrils daily.   loratadine 10 MG tablet Commonly known as: CLARITIN Take 10 mg by mouth daily.   meclizine 25 MG tablet Commonly known as: ANTIVERT Take 25 mg by mouth 3 (three) times daily as needed.   PRENATAL PO Take by mouth.        Raford Bunk, MSN, CNM 03/27/2024 3:52 PM  Certified Nurse Midwife, [facility]

## 2024-03-27 NOTE — Discharge Instructions (Signed)
  Contraindications to Waterbirth at [facility]:   History of c-section  Preterm birth less than 37 weeks  Thick, particulate meconium-stained fluid  Maternal fever over 101 degrees Fahrenheit  Heavy bleeding or signs of placental abruption  Pre-eclampsia, Chronic hypertension or Gestational Hypertension  Any abnormal fetal heart rate pattern  If epidural analgesia is utilized during Chief Executive Officer  Multiple gestation pregnancy  Active communicable disease   Significant limitation to mobility  Preexisting Diabetes, A2 Gestational diabetes or Uncontrolled A1 Gestational diabetes  Any other indication based on medical provider discretion  Special Considerations: Some maternal conditions that may become contraindications by provider discretion are history of seizure or syncope, especially without documentation of management or resolution.  The patient will be required to leave the birthing tub if there is a situation warranting a more complete  fetal assessment, continuous fetal monitoring and/or the necessity for the infant to be delivered outside  the tub.

## 2024-03-28 ENCOUNTER — Ambulatory Visit: Admitting: Dermatology

## 2024-03-28 LAB — GC/CHLAMYDIA PROBE AMP (~~LOC~~) NOT AT ARMC
Chlamydia: NEGATIVE
Comment: NEGATIVE
Comment: NORMAL
Neisseria Gonorrhea: NEGATIVE

## 2024-03-29 ENCOUNTER — Encounter: Payer: Self-pay | Admitting: Obstetrics & Gynecology

## 2024-03-29 ENCOUNTER — Ambulatory Visit: Payer: 59 | Admitting: Obstetrics & Gynecology

## 2024-03-29 VITALS — BP 124/77 | HR 102 | Wt 215.0 lb

## 2024-03-29 DIAGNOSIS — O0990 Supervision of high risk pregnancy, unspecified, unspecified trimester: Secondary | ICD-10-CM

## 2024-03-29 DIAGNOSIS — O09529 Supervision of elderly multigravida, unspecified trimester: Secondary | ICD-10-CM | POA: Diagnosis not present

## 2024-03-29 DIAGNOSIS — O43199 Other malformation of placenta, unspecified trimester: Secondary | ICD-10-CM

## 2024-03-29 DIAGNOSIS — Z3A32 32 weeks gestation of pregnancy: Secondary | ICD-10-CM | POA: Diagnosis not present

## 2024-03-29 NOTE — Patient Instructions (Signed)

## 2024-03-29 NOTE — Progress Notes (Addendum)
   PRENATAL VISIT NOTE  Subjective:  Barbara Baker is a 43 y.o. G3P1011 at [redacted]w[redacted]d being seen today for ongoing prenatal care.  She is currently monitored for the following issues for this high-risk pregnancy and has Supervision of high risk pregnancy, antepartum; Maternal age 45+, multigravida, antepartum; GBS (group B streptococcus) in urine complicating pregnancy; Allergic rhinitis; Episodic cluster headache, not intractable; Prediabetes; Psoriasis; and Marginal insertion of umbilical cord affecting management of mother on their problem list.  Patient was seen in MAU yesterday after a fall, had reassuring evaluation. Undergoing treatment for BV. No concern now. Contractions: Irritability. Vag. Bleeding: None.  Movement: Present. Denies leaking of fluid.   The following portions of the patient's history were reviewed and updated as appropriate: allergies, current medications, past family history, past medical history, past social history, past surgical history and problem list.   Objective:   Vitals:   03/29/24 1609  BP: 124/77  Pulse: (!) 102  Weight: 215 lb (97.5 kg)    Fetal Status: Fetal Heart Rate (bpm): 142   Movement: Present     General:  Alert, oriented and cooperative. Patient is in no acute distress.  Skin: Skin is warm and dry. No rash noted.   Cardiovascular: Normal heart rate noted  Respiratory: Normal respiratory effort, no problems with respiration noted  Abdomen: Soft, gravid, appropriate for gestational age.  Pain/Pressure: Absent     Pelvic: Cervical exam deferred        Extremities: Normal range of motion.  Edema: None  Mental Status: Normal mood and affect. Normal behavior. Normal judgment and thought content.   Assessment and Plan:  Pregnancy: G3P1011 at [redacted]w[redacted]d 1. Marginal insertion of umbilical cord affecting management of mother (Primary) 2. Maternal age 25+, multigravida, antepartum 3. [redacted] weeks gestation of pregnancy 4. Supervision of high risk  pregnancy, antepartum Continue scans as per MFM. Will start antenatal testing weekly starting at 34 weeks given age 85. Short term disability and other paperwork to be filled out for patient. Preterm labor symptoms and general obstetric precautions including but not limited to vaginal bleeding, contractions, leaking of fluid and fetal movement were reviewed in detail with the patient. Please refer to After Visit Summary for other counseling recommendations.   Return in about 2 weeks (around 04/12/2024) for OFFICE OB VISIT (MD only).  Future Appointments  Date Time Provider Department Center  03/30/2024 10:00 AM [facility]  04/12/2024  3:10 PM [facility]ST [facility]  04/12/2024  3:30 PM Raynell Caller, MD [facility]  04/19/2024  3:10 PM [facility]  04/26/2024  3:50 PM Darly Massi, Kathrine Paris, MD [facility]  04/27/2024  4:00 PM [facility]  05/03/2024  3:10 PM [facility]  05/03/2024  3:30 PM Constant, Carles Cheadle, MD [facility]  05/10/2024  2:10 PM [facility] NST [facility]  05/10/2024  3:30 PM Granville Layer, MD [facility]  05/17/2024  2:10 PM [facility]  05/17/2024  3:30 PM Izell Marsh, MD [facility]  06/14/2024  8:30 AM Elta Halter, MD [facility] None    Lenoard Rad, MD

## 2024-03-30 ENCOUNTER — Encounter: Payer: Self-pay | Admitting: *Deleted

## 2024-03-30 ENCOUNTER — Ambulatory Visit: Attending: Maternal & Fetal Medicine

## 2024-03-30 ENCOUNTER — Other Ambulatory Visit: Payer: Self-pay

## 2024-03-30 VITALS — BP 125/59 | HR 116

## 2024-03-30 DIAGNOSIS — Z3A32 32 weeks gestation of pregnancy: Secondary | ICD-10-CM | POA: Diagnosis not present

## 2024-03-30 DIAGNOSIS — O43193 Other malformation of placenta, third trimester: Secondary | ICD-10-CM | POA: Diagnosis not present

## 2024-03-30 DIAGNOSIS — Z362 Encounter for other antenatal screening follow-up: Secondary | ICD-10-CM | POA: Insufficient documentation

## 2024-03-30 DIAGNOSIS — O358XX0 Maternal care for other (suspected) fetal abnormality and damage, not applicable or unspecified: Secondary | ICD-10-CM

## 2024-03-30 DIAGNOSIS — O359XX0 Maternal care for (suspected) fetal abnormality and damage, unspecified, not applicable or unspecified: Secondary | ICD-10-CM | POA: Diagnosis not present

## 2024-03-30 DIAGNOSIS — O43199 Other malformation of placenta, unspecified trimester: Secondary | ICD-10-CM

## 2024-03-30 DIAGNOSIS — O09523 Supervision of elderly multigravida, third trimester: Secondary | ICD-10-CM | POA: Insufficient documentation

## 2024-03-30 DIAGNOSIS — R9389 Abnormal findings on diagnostic imaging of other specified body structures: Secondary | ICD-10-CM

## 2024-03-30 DIAGNOSIS — O0990 Supervision of high risk pregnancy, unspecified, unspecified trimester: Secondary | ICD-10-CM

## 2024-03-30 DIAGNOSIS — B951 Streptococcus, group B, as the cause of diseases classified elsewhere: Secondary | ICD-10-CM

## 2024-03-30 DIAGNOSIS — O09529 Supervision of elderly multigravida, unspecified trimester: Secondary | ICD-10-CM

## 2024-04-05 ENCOUNTER — Ambulatory Visit: Payer: 59 | Admitting: Dermatology

## 2024-04-06 ENCOUNTER — Encounter (HOSPITAL_COMMUNITY): Payer: Self-pay | Admitting: Obstetrics and Gynecology

## 2024-04-06 ENCOUNTER — Inpatient Hospital Stay (HOSPITAL_COMMUNITY)
Admission: AD | Admit: 2024-04-06 | Discharge: 2024-04-06 | Disposition: A | Discharge status: Home / Self Care | Attending: Obstetrics and Gynecology | Admitting: Obstetrics and Gynecology

## 2024-04-06 DIAGNOSIS — Z3A33 33 weeks gestation of pregnancy: Secondary | ICD-10-CM | POA: Diagnosis not present

## 2024-04-06 DIAGNOSIS — O09523 Supervision of elderly multigravida, third trimester: Secondary | ICD-10-CM | POA: Diagnosis not present

## 2024-04-06 DIAGNOSIS — O26893 Other specified pregnancy related conditions, third trimester: Secondary | ICD-10-CM | POA: Diagnosis present

## 2024-04-06 DIAGNOSIS — O4703 False labor before 37 completed weeks of gestation, third trimester: Secondary | ICD-10-CM | POA: Insufficient documentation

## 2024-04-06 DIAGNOSIS — O139 Gestational [pregnancy-induced] hypertension without significant proteinuria, unspecified trimester: Secondary | ICD-10-CM

## 2024-04-06 DIAGNOSIS — R42 Dizziness and giddiness: Secondary | ICD-10-CM | POA: Insufficient documentation

## 2024-04-06 DIAGNOSIS — Z3689 Encounter for other specified antenatal screening: Secondary | ICD-10-CM

## 2024-04-06 DIAGNOSIS — O133 Gestational [pregnancy-induced] hypertension without significant proteinuria, third trimester: Secondary | ICD-10-CM | POA: Diagnosis not present

## 2024-04-06 HISTORY — DX: Gestational (pregnancy-induced) hypertension without significant proteinuria, unspecified trimester: O13.9

## 2024-04-06 HISTORY — DX: Psoriasis, unspecified: L40.9

## 2024-04-06 HISTORY — DX: Headache, unspecified: R51.9

## 2024-04-06 LAB — URINALYSIS, MICROSCOPIC (REFLEX): Bacteria, UA: NONE SEEN

## 2024-04-06 LAB — URINALYSIS, ROUTINE W REFLEX MICROSCOPIC
Bilirubin Urine: NEGATIVE
Glucose, UA: NEGATIVE mg/dL
Hgb urine dipstick: NEGATIVE
Ketones, ur: NEGATIVE mg/dL
Nitrite: NEGATIVE
Specific Gravity, Urine: 1.02 (ref 1.005–1.030)
pH: 6 (ref 5.0–8.0)

## 2024-04-06 LAB — GLUCOSE, CAPILLARY: Glucose-Capillary: 93 mg/dL (ref 70–99)

## 2024-04-06 LAB — CBC
HCT: 33.6 % — ABNORMAL LOW (ref 36.0–46.0)
Hemoglobin: 11.7 g/dL — ABNORMAL LOW (ref 12.0–15.0)
MCH: 29.4 pg (ref 26.0–34.0)
MCHC: 34.8 g/dL (ref 30.0–36.0)
MCV: 84.4 fL (ref 80.0–100.0)
Platelets: 253 10*3/uL (ref 150–400)
RBC: 3.98 MIL/uL (ref 3.87–5.11)
RDW: 14.5 % (ref 11.5–15.5)
WBC: 7.8 10*3/uL (ref 4.0–10.5)
nRBC: 0 % (ref 0.0–0.2)

## 2024-04-06 LAB — COMPREHENSIVE METABOLIC PANEL WITH GFR
ALT: 14 U/L (ref 0–44)
AST: 22 U/L (ref 15–41)
Albumin: 2.9 g/dL — ABNORMAL LOW (ref 3.5–5.0)
Alkaline Phosphatase: 99 U/L (ref 38–126)
Anion gap: 9 (ref 5–15)
BUN: <5 mg/dL — ABNORMAL LOW (ref 6–20)
CO2: 19 mmol/L — ABNORMAL LOW (ref 22–32)
Calcium: 9.5 mg/dL (ref 8.9–10.3)
Chloride: 108 mmol/L (ref 98–111)
Creatinine, Ser: 0.94 mg/dL (ref 0.44–1.00)
GFR, Estimated: >60 mL/min (ref >60)
Glucose, Bld: 91 mg/dL (ref 70–99)
Potassium: 3.9 mmol/L (ref 3.5–5.1)
Sodium: 136 mmol/L (ref 135–145)
Total Bilirubin: 0.7 mg/dL (ref 0.0–1.2)
Total Protein: 6.8 g/dL (ref 6.5–8.1)

## 2024-04-06 LAB — FETAL FIBRONECTIN: Fetal Fibronectin: NEGATIVE

## 2024-04-06 LAB — PROTEIN / CREATININE RATIO, URINE
Creatinine, Urine: 47 mg/dL
Total Protein, Urine: <6 mg/dL

## 2024-04-06 MED ORDER — NIFEDIPINE 10 MG PO CAPS
10.0000 mg | ORAL_CAPSULE | ORAL | Status: DC | PRN
  Administered 2024-04-06 (×2): 10 mg via ORAL
  Filled 2024-04-06 (×2): qty 1

## 2024-04-06 MED ORDER — NIFEDIPINE ER OSMOTIC RELEASE 30 MG PO TB24: 30.0000 mg | ORAL_TABLET | Freq: Every day | ORAL | 1 refills | Status: DC

## 2024-04-06 NOTE — MAU Provider Note (Signed)
 History     CSN: [id number]  Arrival date and time: 04/06/24 1329   Event Date/Time   First Provider Initiated Contact with Patient 04/06/24 1540      Chief Complaint  Patient presents with   Pelvic Pain   Dizziness   Barbara Baker is a 43 y.o. G3P1011 at [redacted]w[redacted]d who receives care at [facility].  She presents today for back pain and pelvic pressure. Patient reports pain has been present since Monday.  She reports the pain is intermittent and worsened with standing and walking.  She reports she experiences back pain that occurs with pelvic pressure and lightening crotch.  She states the pain lasts ~10-15 seconds.  She denies vaginal bleeding or discharge, but reports losing her mucous plug last weekend.  She also denies recent sexual activity.   Patient also reports some dizziness that has been present since Migraine on Sunday.  She states she has been having spells 2-3x/day.  She thinks it may be due to using multiple monitors, simultaneously, at work. Patient reports eating chicken biscuit and fries around 1015 and had candy while en route to hospital.    Patient endorses fetal movement.    OB History     Gravida  3   Para  1   Term  1   Preterm      AB  1   Living  1      SAB  1   IAB      Ectopic      Multiple      Live Births  1        Obstetric Comments  2009  PTL, rec'd BMZ         Past Medical History:  Diagnosis Date   Dermatitis    Seborrheic dermatitis   Headache    Psoriasis     Past Surgical History:  Procedure Laterality Date   BREAST BIOPSY Left 04/30/2022   stereo biopsy/x clip. path pending   BREAST REDUCTION SURGERY Bilateral 06/20/2022   Procedure: MAMMARY REDUCTION  (BREAST);  Surgeon: Alger Infield, MD;  Location: [facility];  Service: Plastics;  Laterality: Bilateral;   DILATION AND CURETTAGE OF UTERUS      Family History  Problem Relation Age of Onset   Ovarian cancer Mother 65   Asthma Mother     Sarcoidosis Mother    Other Mother        kidney issues   Heart failure Father    Diabetes Father    Hypertension Father    Psoriasis Son    Eczema Son     Social History   Tobacco Use   Smoking status: Never   Smokeless tobacco: Never  Vaping Use   Vaping status: Never Used  Substance Use Topics   Alcohol use: Never   Drug use: Never    Allergies:  Allergies  Allergen Reactions   Sulfa Antibiotics Itching    Medications Prior to Admission  Medication Sig Dispense Refill Last Dose/Taking   aspirin  EC 81 MG tablet Take 1 tablet (81 mg total) by mouth at bedtime. Start taking when you are [redacted] weeks pregnant for rest of pregnancy for prevention of preeclampsia 300 tablet 2 04/05/2024   Ciclopirox  1 % shampoo Apply 1 application  topically once a week. Apply to scalp let sit 5 minutes and rinse out 120 mL 6 Past Month   clobetasol  (OLUX ) 0.05 % topical foam Apply topically as directed. Qd aa psoriasis on scalp up  to 5 day per week, avoid face, groin, axilla 100 g 6 Past Month   dextromethorphan-guaiFENesin (MUCINEX DM) 30-600 MG 12hr tablet Take 1 tablet by mouth 2 (two) times daily.   04/06/2024   Fluocinolone  Acetonide Body 0.01 % OIL Apply 1 application  topically daily. Qd to aa scalp prn flares 118 mL 2 04/06/2024   fluticasone  (FLONASE ) 50 MCG/ACT nasal spray Place 1 spray into both nostrils daily. 9.9 mL 2 Past Month   loratadine (CLARITIN) 10 MG tablet Take 10 mg by mouth daily.   04/05/2024 Evening   Prenatal Vit-Fe Fumarate-FA (PRENATAL PO) Take by mouth.   04/06/2024   meclizine (ANTIVERT) 25 MG tablet Take 25 mg by mouth 3 (three) times daily as needed.   More than a month   metroNIDAZOLE  (FLAGYL ) 500 MG tablet Take 1 tablet (500 mg total) by mouth 2 (two) times daily. 14 tablet 0     Review of Systems  Genitourinary:  Negative for difficulty urinating and dysuria.  Musculoskeletal:  Positive for back pain.  Neurological:  Positive for dizziness. Negative for  light-headedness and headaches.   Physical Exam   Blood pressure (!) 142/68, pulse (!) 107, temperature 98.1 F (36.7 C), resp. rate (!) 8, height 5' 6.5" (1.689 m), weight 101.2 kg, last menstrual period 08/14/2023, SpO2 98%. Results for orders placed or performed during the hospital encounter of 04/06/24 (from the past 24 hours)  Urinalysis, Routine w reflex microscopic -Urine, Clean Catch     Status: Abnormal   Collection Time: 04/06/24  2:18 PM  Result Value Ref Range   Color, Urine YELLOW YELLOW   APPearance CLEAR CLEAR   Specific Gravity, Urine 1.020 1.005 - 1.030   pH 6.0 5.0 - 8.0   Glucose, UA NEGATIVE NEGATIVE mg/dL   Hgb urine dipstick NEGATIVE NEGATIVE   Bilirubin Urine NEGATIVE NEGATIVE   Ketones, ur NEGATIVE NEGATIVE mg/dL   Protein, ur TRACE (A) NEGATIVE mg/dL   Nitrite NEGATIVE NEGATIVE   Leukocytes,Ua TRACE (A) NEGATIVE  Urinalysis, Microscopic (reflex)     Status: None   Collection Time: 04/06/24  2:18 PM  Result Value Ref Range   RBC / HPF 0-5 0 - 5 RBC/hpf   WBC, UA 0-5 0 - 5 WBC/hpf   Bacteria, UA NONE SEEN NONE SEEN   Squamous Epithelial / HPF 0-5 0 - 5 /HPF  Fetal fibronectin     Status: None   Collection Time: 04/06/24  3:55 PM  Result Value Ref Range   Fetal Fibronectin NEGATIVE NEGATIVE  Protein / creatinine ratio, urine     Status: None   Collection Time: 04/06/24  4:07 PM  Result Value Ref Range   Creatinine, Urine 47 mg/dL   Total Protein, Urine <6 mg/dL   Protein Creatinine Ratio        0.00 - 0.15 mg/mg[Cre]  Glucose, capillary     Status: None   Collection Time: 04/06/24  4:23 PM  Result Value Ref Range   Glucose-Capillary 93 70 - 99 mg/dL  CBC     Status: Abnormal   Collection Time: 04/06/24  4:24 PM  Result Value Ref Range   WBC 7.8 4.0 - 10.5 K/uL   RBC 3.98 3.87 - 5.11 MIL/uL   Hemoglobin 11.7 (L) 12.0 - 15.0 g/dL   HCT 60.4 (L) 54.0 - 98.1 %   MCV 84.4 80.0 - 100.0 fL   MCH 29.4 26.0 - 34.0 pg   MCHC 34.8 30.0 - 36.0 g/dL   RDW  14.5 11.5 - 15.5 %   Platelets 253 150 - 400 K/uL   nRBC 0.0 0.0 - 0.2 %  Comprehensive metabolic panel with GFR     Status: Abnormal   Collection Time: 04/06/24  4:24 PM  Result Value Ref Range   Sodium 136 135 - 145 mmol/L   Potassium 3.9 3.5 - 5.1 mmol/L   Chloride 108 98 - 111 mmol/L   CO2 19 (L) 22 - 32 mmol/L   Glucose, Bld 91 70 - 99 mg/dL   BUN <5 (L) 6 - 20 mg/dL   Creatinine, Ser 8.46 0.44 - 1.00 mg/dL   Calcium 9.5 8.9 - 96.2 mg/dL   Total Protein 6.8 6.5 - 8.1 g/dL   Albumin 2.9 (L) 3.5 - 5.0 g/dL   AST 22 15 - 41 U/L   ALT 14 0 - 44 U/L   Alkaline Phosphatase 99 38 - 126 U/L   Total Bilirubin 0.7 0.0 - 1.2 mg/dL   GFR, Estimated >95 >28 mL/min   Anion gap 9 5 - 15     Physical Exam Vitals reviewed. Exam conducted with a chaperone present Verlyn Goad, RN).  Constitutional:      Appearance: Normal appearance.  HENT:     Head: Normocephalic and atraumatic.  Eyes:     Conjunctiva/sclera: Conjunctivae normal.  Cardiovascular:     Rate and Rhythm: Normal rate.  Pulmonary:     Effort: Pulmonary effort is normal.  Genitourinary:    Comments: Speculum Exam: -Normal External Genitalia: Non tender, no apparent discharge at introitus.  -Vaginal Vault: Pink mucosa with good rugae. Small bartholin cyst noted on right side-Nontender. fFN collected from posterior fornix. -Cervix:Pink, no lesions, cysts, or polyps.  Appears closed. Friable with small bleeding noted at 8'oclock. -Bimanual Exam: Dilation: Closed Exam by:: Nivin Braniff CNM Musculoskeletal:        General: Normal range of motion.     Cervical back: Normal range of motion.  Skin:    General: Skin is warm and dry.  Neurological:     Mental Status: She is alert and oriented to person, place, and time.  Psychiatric:        Mood and Affect: Mood normal.        Behavior: Behavior normal.     Fetal Assessment 140 bpm, Mod Var, -Decels, +15x15 Accels Toco: Irregular Ctx with Irritability   MAU Course   Results for  orders placed or performed during the hospital encounter of 04/06/24 (from the past 24 hours)  Urinalysis, Routine w reflex microscopic -Urine, Clean Catch     Status: Abnormal   Collection Time: 04/06/24  2:18 PM  Result Value Ref Range   Color, Urine YELLOW YELLOW   APPearance CLEAR CLEAR   Specific Gravity, Urine 1.020 1.005 - 1.030   pH 6.0 5.0 - 8.0   Glucose, UA NEGATIVE NEGATIVE mg/dL   Hgb urine dipstick NEGATIVE NEGATIVE   Bilirubin Urine NEGATIVE NEGATIVE   Ketones, ur NEGATIVE NEGATIVE mg/dL   Protein, ur TRACE (A) NEGATIVE mg/dL   Nitrite NEGATIVE NEGATIVE   Leukocytes,Ua TRACE (A) NEGATIVE  Urinalysis, Microscopic (reflex)     Status: None   Collection Time: 04/06/24  2:18 PM  Result Value Ref Range   RBC / HPF 0-5 0 - 5 RBC/hpf   WBC, UA 0-5 0 - 5 WBC/hpf   Bacteria, UA NONE SEEN NONE SEEN   Squamous Epithelial / HPF 0-5 0 - 5 /HPF  Fetal fibronectin     Status: None  Collection Time: 04/06/24  3:55 PM  Result Value Ref Range   Fetal Fibronectin NEGATIVE NEGATIVE  Protein / creatinine ratio, urine     Status: None   Collection Time: 04/06/24  4:07 PM  Result Value Ref Range   Creatinine, Urine 47 mg/dL   Total Protein, Urine <6 mg/dL   Protein Creatinine Ratio        0.00 - 0.15 mg/mg[Cre]  Glucose, capillary     Status: None   Collection Time: 04/06/24  4:23 PM  Result Value Ref Range   Glucose-Capillary 93 70 - 99 mg/dL  CBC     Status: Abnormal   Collection Time: 04/06/24  4:24 PM  Result Value Ref Range   WBC 7.8 4.0 - 10.5 K/uL   RBC 3.98 3.87 - 5.11 MIL/uL   Hemoglobin 11.7 (L) 12.0 - 15.0 g/dL   HCT 16.1 (L) 09.6 - 04.5 %   MCV 84.4 80.0 - 100.0 fL   MCH 29.4 26.0 - 34.0 pg   MCHC 34.8 30.0 - 36.0 g/dL   RDW 40.9 81.1 - 91.4 %   Platelets 253 150 - 400 K/uL   nRBC 0.0 0.0 - 0.2 %  Comprehensive metabolic panel with GFR     Status: Abnormal   Collection Time: 04/06/24  4:24 PM  Result Value Ref Range   Sodium 136 135 - 145 mmol/L   Potassium  3.9 3.5 - 5.1 mmol/L   Chloride 108 98 - 111 mmol/L   CO2 19 (L) 22 - 32 mmol/L   Glucose, Bld 91 70 - 99 mg/dL   BUN <5 (L) 6 - 20 mg/dL   Creatinine, Ser 7.82 0.44 - 1.00 mg/dL   Calcium 9.5 8.9 - 95.6 mg/dL   Total Protein 6.8 6.5 - 8.1 g/dL   Albumin 2.9 (L) 3.5 - 5.0 g/dL   AST 22 15 - 41 U/L   ALT 14 0 - 44 U/L   Alkaline Phosphatase 99 38 - 126 U/L   Total Bilirubin 0.7 0.0 - 1.2 mg/dL   GFR, Estimated >21 >30 mL/min   Anion gap 9 5 - 15   No results found.  MDM Physical Exam Labs: CBC, CMP, PC Ratio, fFN Measure BPQ15 min EFM Antihypertensive/Tocolytic Oral Hydration Prescription Consult Assessment and Plan  43 year old G3P1011  SIUP at 33.5 weeks Cat I FT Preterm Contractions Elevated BP  -POC reviewed.  -Reviewed recommendation for fFN and discussed management of results. Patient agreeable. -Exam performed and findings discussed. -Reassured cervix closed.  -Discussed oral hydration and if no improvement with ctx will start procardia dosing per protocol. Patient agreeable. -Labs ordered. -Monitor and reassess.   Kraig Peru MSN, CNM 04/06/2024, 3:40 PM   Reassessment (5:40 PM) -Labs normal. -BP remain elevated, but not severe. -Provider to bedside and patient reports no pain s/p 2 doses of procardia. -Discussed elevated blood pressure and recommendation for antihypertensives. -Patient expresses disappointment, but understanding. -Informed that MD will be contacted regarding plan and will modify accordingly. -Dr. Burrell Casa consulted and informed of patient status, evaluation, interventions, and results. Advised: *Agree with procardia 30XL daily. *Dx with GHTN although <4 hrs, but >2 with consistent elevations.  -Patient updated and without questions. -Instructed to keep next appt as scheduled (Tuesday May 13th). -Precautions reviewed. -Encouraged to call primary office or return to MAU if symptoms worsen or with the onset of new symptoms. -Discharged  to home in stable condition.  Kraig Peru MSN, CNM Advanced Practice Provider, Center  for [employer]

## 2024-04-06 NOTE — MAU Note (Signed)
 MAU Triage Note  .Barbara Baker is a 43 y.o. at [redacted]w[redacted]d here in MAU reporting: put a dresser together on Monday. Has been having pelvic pressure and low back pain ever since. Thinks she lost part of her mucus plug over the weekend but denies any vag discharge today. Stated she had a headache over the weekend but is is gone but has felt dizzy ever since.   LMP:  Onset of complaint: Monday Pain score: 8-10 Vitals:   04/06/24 1414  BP: 139/70  Pulse: (!) 105  Resp: (!) 8  Temp: 98.1 F (36.7 C)     FHT: 138  Lab orders placed from triage: u/a

## 2024-04-10 ENCOUNTER — Encounter (HOSPITAL_COMMUNITY): Payer: Self-pay | Admitting: Obstetrics and Gynecology

## 2024-04-10 ENCOUNTER — Inpatient Hospital Stay (HOSPITAL_COMMUNITY)
Admission: AD | Admit: 2024-04-10 | Discharge: 2024-04-10 | Disposition: A | Discharge status: Home / Self Care | Attending: Obstetrics and Gynecology | Admitting: Obstetrics and Gynecology

## 2024-04-10 ENCOUNTER — Other Ambulatory Visit: Payer: Self-pay

## 2024-04-10 DIAGNOSIS — T887XXA Unspecified adverse effect of drug or medicament, initial encounter: Secondary | ICD-10-CM | POA: Diagnosis not present

## 2024-04-10 DIAGNOSIS — R519 Headache, unspecified: Secondary | ICD-10-CM | POA: Diagnosis not present

## 2024-04-10 DIAGNOSIS — O26893 Other specified pregnancy related conditions, third trimester: Secondary | ICD-10-CM | POA: Diagnosis not present

## 2024-04-10 DIAGNOSIS — Z3A34 34 weeks gestation of pregnancy: Secondary | ICD-10-CM | POA: Diagnosis not present

## 2024-04-10 DIAGNOSIS — O133 Gestational [pregnancy-induced] hypertension without significant proteinuria, third trimester: Secondary | ICD-10-CM

## 2024-04-10 LAB — PROTEIN / CREATININE RATIO, URINE
Creatinine, Urine: 73 mg/dL
Protein Creatinine Ratio: 0.08 mg/mg{creat} (ref 0.00–0.15)
Total Protein, Urine: 6 mg/dL

## 2024-04-10 LAB — COMPREHENSIVE METABOLIC PANEL WITH GFR
ALT: 14 U/L (ref 0–44)
AST: 19 U/L (ref 15–41)
Albumin: 2.9 g/dL — ABNORMAL LOW (ref 3.5–5.0)
Alkaline Phosphatase: 98 U/L (ref 38–126)
Anion gap: 9 (ref 5–15)
BUN: <5 mg/dL — ABNORMAL LOW (ref 6–20)
CO2: 21 mmol/L — ABNORMAL LOW (ref 22–32)
Calcium: 9.8 mg/dL (ref 8.9–10.3)
Chloride: 107 mmol/L (ref 98–111)
Creatinine, Ser: 0.79 mg/dL (ref 0.44–1.00)
GFR, Estimated: >60 mL/min (ref >60)
Glucose, Bld: 110 mg/dL — ABNORMAL HIGH (ref 70–99)
Potassium: 3.6 mmol/L (ref 3.5–5.1)
Sodium: 137 mmol/L (ref 135–145)
Total Bilirubin: 0.9 mg/dL (ref 0.0–1.2)
Total Protein: 6.8 g/dL (ref 6.5–8.1)

## 2024-04-10 LAB — CBC
HCT: 33.8 % — ABNORMAL LOW (ref 36.0–46.0)
Hemoglobin: 11.6 g/dL — ABNORMAL LOW (ref 12.0–15.0)
MCH: 29.4 pg (ref 26.0–34.0)
MCHC: 34.3 g/dL (ref 30.0–36.0)
MCV: 85.8 fL (ref 80.0–100.0)
Platelets: 236 10*3/uL (ref 150–400)
RBC: 3.94 MIL/uL (ref 3.87–5.11)
RDW: 14.4 % (ref 11.5–15.5)
WBC: 8.2 10*3/uL (ref 4.0–10.5)
nRBC: 0 % (ref 0.0–0.2)

## 2024-04-10 MED ORDER — BUTALBITAL-APAP-CAFFEINE 50-325-40 MG PO TABS
2.0000 | ORAL_TABLET | Freq: Once | ORAL | Status: AC
  Administered 2024-04-10: 2 via ORAL
  Filled 2024-04-10: qty 2

## 2024-04-10 MED ORDER — LABETALOL HCL 200 MG PO TABS: 100.0000 mg | ORAL_TABLET | Freq: Two times a day (BID) | ORAL | 1 refills | Status: DC

## 2024-04-10 MED ORDER — BUTALBITAL-APAP-CAFFEINE 50-325-40 MG PO CAPS: 1.0000 | ORAL_CAPSULE | Freq: Four times a day (QID) | ORAL | 0 refills | Status: DC | PRN

## 2024-04-10 NOTE — MAU Note (Signed)
 Barbara Baker is a 43 y.o. at [redacted]w[redacted]d here in MAU reporting: high blood pressures at home and a headache. Patient just started taking Procardia  for her blood pressures last night. Patient denies having visual changes. Since she took the first dose, her head has been hurting. Patient denies having LOF or vaginal bleeding. Patient reports having occasional contractions. Patient reports feeling fetal movement.  Onset of complaint: 0200 today Pain score: 10 Vitals:   04/10/24 1454  BP: (!) 151/74  Pulse: (!) 118  Resp: 16  Temp: 98.3 F (36.8 C)  SpO2: 99%     FHT: 130

## 2024-04-10 NOTE — MAU Provider Note (Signed)
 MAU Provider Note  Chief Complaint: Hypertension and Headache   Event Date/Time   First Provider Initiated Contact with Patient 04/10/24 1523      SUBJECTIVE HPI: Barbara Baker is a 43 y.o. G3P1011 at [redacted]w[redacted]d by LMP who presents to maternity admissions reporting headache. Pregnancy c/b new diagnosis of gestational hypertension 5/8, AMA, history of cluster headaches, prediabetes, marginal insertion. Receives [facility] with [place].  Patient presents with severe headache that started last night.  Started shortly after taking Procardia  for the first time.  This is in all over headache.  She does have a history of cluster headaches but states this is not similar.  She has tried Tylenol  at home which was unhelpful.  She denies phonophobia, photophobia, nausea/vomiting.  She denies vision changes, right upper quadrant pain, chest pain, shortness of breath.  Does note that her fingers have seemed to be swollen lately.  She is having positive fetal movement.  Denies vaginal bleeding, loss of fluid, urinary symptoms, change in discharge.  HPI  Past Medical History:  Diagnosis Date   Dermatitis    Seborrheic dermatitis   Headache    Psoriasis    Past Surgical History:  Procedure Laterality Date   BREAST BIOPSY Left 04/30/2022   stereo biopsy/x clip. path pending   BREAST REDUCTION SURGERY Bilateral 06/20/2022   Procedure: MAMMARY REDUCTION  (BREAST);  Surgeon: Alger Infield, MD;  Location: [facility];  Service: Plastics;  Laterality: Bilateral;   DILATION AND CURETTAGE OF UTERUS     Social History   Socioeconomic History   Marital status: Single    Spouse name: Not on file   Number of children: Not on file   Years of education: Not on file   Highest education level: Not on file  Occupational History   Not on file  Tobacco Use   Smoking status: Never   Smokeless tobacco: Never  Vaping Use   Vaping status: Never Used  Substance and Sexual Activity   Alcohol use:  Never   Drug use: Never   Sexual activity: Yes  Other Topics Concern   Not on file  Social History Narrative   Not on file   Social Drivers of Health   Financial Resource Strain: Low Risk  (04/17/2023)   Received from [facility], [facility]   Overall Financial Resource Strain (CARDIA)    Difficulty of Paying Living Expenses: Not hard at all  Food Insecurity: No Food Insecurity (03/27/2024)   Hunger Vital Sign    Worried About Running Out of Food in the Last Year: Never true    Ran Out of Food in the Last Year: Never true  Transportation Needs: No Transportation Needs (03/27/2024)   PRAPARE - Administrator, Civil Service (Medical): No    Lack of Transportation (Non-Medical): No  Physical Activity: Not on file  Stress: Not on file  Social Connections: Not on file  Intimate Partner Violence: Not At Risk (03/27/2024)   Humiliation, Afraid, Rape, and Kick questionnaire    Fear of Current or Ex-Partner: No    Emotionally Abused: No    Physically Abused: No    Sexually Abused: No   No current facility-administered medications on file prior to encounter.   Current Outpatient Medications on File Prior to Encounter  Medication Sig Dispense Refill   aspirin  EC 81 MG tablet Take 1 tablet (81 mg total) by mouth at bedtime. Start taking when you are [redacted] weeks pregnant for  rest of pregnancy for prevention of preeclampsia 300 tablet 2   fluticasone  (FLONASE ) 50 MCG/ACT nasal spray Place 1 spray into both nostrils daily. 9.9 mL 2   loratadine (CLARITIN) 10 MG tablet Take 10 mg by mouth daily.     NIFEdipine  (PROCARDIA  XL) 30 MG 24 hr tablet Take 1 tablet (30 mg total) by mouth daily. 60 tablet 1   Prenatal Vit-Fe Fumarate-FA (PRENATAL PO) Take by mouth.     meclizine (ANTIVERT) 25 MG tablet Take 25 mg by mouth 3 (three) times daily as needed.     Allergies  Allergen Reactions   Procardia  Xl [Nifedipine  Er] Other (See Comments)    Headache    Sulfa Antibiotics Itching    ROS:  Pertinent positives/negatives listed above.  I have reviewed patient's Past Medical Hx, Surgical Hx, Family Hx, Social Hx, medications and allergies.   Physical Exam  Patient Vitals for the past 24 hrs:  BP Temp Temp src Pulse Resp SpO2 Height Weight  04/10/24 1545 133/69 -- -- (!) 108 -- -- -- --  04/10/24 1516 (!) 140/68 -- -- (!) 109 -- -- -- --  04/10/24 1454 (!) 151/74 98.3 F (36.8 C) Oral (!) 118 16 99 % 5' 6.5" (1.689 m) 103.6 kg   Constitutional: Well-developed, well-nourished female in no acute distress.  Appears uncomfortable Cardiovascular: normal rate Respiratory: normal effort GI: Abd soft, non-tender MS: Extremities nontender, no edema, normal ROM Neurologic: Alert and oriented x 4.  Logical/linear thought process.  No focal abnormalities GU: Neg CVAT.  FHT:  Baseline 125, moderate variability, accelerations present, no decelerations Contractions: none  LAB RESULTS Results for orders placed or performed during the hospital encounter of 04/10/24 (from the past 24 hours)  Protein / creatinine ratio, urine     Status: None   Collection Time: 04/10/24  3:01 PM  Result Value Ref Range   Creatinine, Urine 73 mg/dL   Total Protein, Urine 6 mg/dL   Protein Creatinine Ratio 0.08 0.00 - 0.15 mg/mg[Cre]  CBC     Status: Abnormal   Collection Time: 04/10/24  3:14 PM  Result Value Ref Range   WBC 8.2 4.0 - 10.5 K/uL   RBC 3.94 3.87 - 5.11 MIL/uL   Hemoglobin 11.6 (L) 12.0 - 15.0 g/dL   HCT 16.1 (L) 09.6 - 04.5 %   MCV 85.8 80.0 - 100.0 fL   MCH 29.4 26.0 - 34.0 pg   MCHC 34.3 30.0 - 36.0 g/dL   RDW 40.9 81.1 - 91.4 %   Platelets 236 150 - 400 K/uL   nRBC 0.0 0.0 - 0.2 %  Comprehensive metabolic panel     Status: Abnormal   Collection Time: 04/10/24  3:14 PM  Result Value Ref Range   Sodium 137 135 - 145 mmol/L   Potassium 3.6 3.5 - 5.1 mmol/L   Chloride 107 98 - 111 mmol/L   CO2 21 (L) 22 - 32 mmol/L   Glucose, Bld 110 (H) 70  - 99 mg/dL   BUN <5 (L) 6 - 20 mg/dL   Creatinine, Ser 7.82 0.44 - 1.00 mg/dL   Calcium 9.8 8.9 - 95.6 mg/dL   Total Protein 6.8 6.5 - 8.1 g/dL   Albumin 2.9 (L) 3.5 - 5.0 g/dL   AST 19 15 - 41 U/L   ALT 14 0 - 44 U/L   Alkaline Phosphatase 98 38 - 126 U/L   Total Bilirubin 0.9 0.0 - 1.2 mg/dL   GFR, Estimated >21 >30 mL/min  Anion gap 9 5 - 15    AB/Positive/-- (12/09 1542)  IMAGING US  MFM OB FOLLOW UP Result Date: 03/30/2024 ----------------------------------------------------------------------  OBSTETRICS REPORT                       (Signed Final 03/30/2024 11:50 am) ---------------------------------------------------------------------- Patient Info  ID #:       [id number]                          D.O.B.:  [birth year] (42 yrs)(F)  Name:       Kingston Penta Mcfann              Visit Date: 03/30/2024 10:20 am ---------------------------------------------------------------------- Performed By  Attending:        Jodeen Munch      Ref. Address:     [address]                    MD                                                             [place], [place]                                                             [address]  Performed By:     Elsie Halo BS      [facility]                    RD[facility] RVT                                 [facility] at                                                             [facility]  Referred By:      Abner Ables MD ---------------------------------------------------------------------- Orders  #  Description                           Code        Ordered By  1  US  MFM OB FOLLOW UP                   95284.13    Jodeen Munch ----------------------------------------------------------------------  #  Order #                     Accession #                Episode #  1  [id number]                   [id number]                 [id number]  ---------------------------------------------------------------------- Indications  Fetal abnormality - other known or             O35.8XX0  suspected (liver calcifications)  Advanced maternal age multigravida 80,         O53.523  third trimester  Neg MT21  Encounter for other antenatal screening        Z36.2  follow-up  [redacted] weeks gestation of pregnancy                Z3A.32 ---------------------------------------------------------------------- Fetal Evaluation  Num Of Fetuses:         1  Fetal Heart Rate(bpm):  155  Cardiac Activity:       Observed  Presentation:           Cephalic  Placenta:               Anterior  P. Cord Insertion:      Previously seen  Amniotic Fluid  AFI FV:      Within normal limits  AFI Sum(cm)     %Tile       Largest Pocket(cm)  14.47           51          5.29  RUQ(cm)       RLQ(cm)       LUQ(cm)        LLQ(cm)  3.28          2.97          5.29           2.93 ---------------------------------------------------------------------- Biometry  BPD:     82.87  mm     G. Age:  33w 2d         62  %    CI:        72.78   %    70 - 86                                                          FL/HC:      21.0   %    19.9 - 21.5  HC:    308.86   mm     G. Age:  34w 3d         60  %    HC/AC:      1.01        0.96 - 1.11  AC:    306.09   mm     G. Age:  34w 4d         92  %    FL/BPD:     78.4   %    71 - 87  FL:         65  mm     G. Age:  33w 4d  60  %    FL/AC:      21.2   %    20 - 24  LV:        3.1  mm  Est. FW:    2360  gm      5 lb 3 oz     83  % ---------------------------------------------------------------------- OB History  Gravidity:    3         Term:   1        Prem:   0        SAB:   1  TOP:          0       Ectopic:  0        Living: 1 ---------------------------------------------------------------------- Gestational Age  LMP:           32w 5d        Date:  08/14/23                  EDD:   05/20/24  U/S Today:     34w 0d                                        EDD:   05/11/24  Best:           32w 5d     Det. By:  LMP  (08/14/23)          EDD:   05/20/24 ---------------------------------------------------------------------- Anatomy  Cranium:               Appears normal         Aortic Arch:            Previously seen  Cavum:                 Appears normal         Ductal Arch:            Previously seen  Ventricles:            Appears normal         Diaphragm:              Appears normal  Choroid Plexus:        Previously seen        Stomach:                Appears normal, left                                                                        sided  Cerebellum:            Previously seen        Abdomen:                Calcifications  Posterior Fossa:       Previously seen        Abdominal Wall:         Previously seen  Face:                  Orbits and profile     Cord  Vessels:           Previously seen                         previously seen  Lips:                  Previously seen        Kidneys:                Appear normal  Thoracic:              Previously seen        Bladder:                Appears normal  Heart:                 Appears normal         Spine:                  Previously seen                         (4CH, axis, and                         situs)  RVOT:                  Appears normal         Upper Extremities:      Previously seen  LVOT:                  Previously seen        Lower Extremities:      Previously seen  Other:  Fetal anatomic survey complete on prior scans. ---------------------------------------------------------------------- Impression  Follow up growth due to liver calcifications  Normal interval growth with measurements consistent with  dates  Good fetal movement and amniotic fluid volume  Liver calcifications were again seen but less than previous.  The area of calfication show a linear pattern under the  diaphragm.  I discussed the normal fetal growth and normal TORCH titers  with no additional sonographic infectous markers.  We will continue serial growth  and suggest postnatal  evaluation.  Ms. Grafton has no further questions. ---------------------------------------------------------------------- Recommendations  Follow up growth in 4 weeks. ----------------------------------------------------------------------              Jodeen Munch, MD Electronically Signed Final Report   03/30/2024 11:50 am ----------------------------------------------------------------------    MAU Management/MDM: Orders Placed This Encounter  Procedures   CBC   Comprehensive metabolic panel   Protein / creatinine ratio, urine   Discharge patient    Meds ordered this encounter  Medications   butalbital-acetaminophen -caffeine (FIORICET) 50-325-40 MG per tablet 2 tablet   Butalbital-APAP-Caffeine 50-325-40 MG capsule    Sig: Take 1-2 capsules by mouth every 6 (six) hours as needed for headache.    Dispense:  15 capsule    Refill:  0   labetalol (NORMODYNE) 200 MG tablet    Sig: Take 0.5 tablets (100 mg total) by mouth 2 (two) times daily.    Dispense:  60 tablet    Refill:  1     Available prenatal records reviewed.  Patient presents with new onset severe headache at [redacted]w[redacted]d in setting of starting Procardia  for gestational hypertension.  Patient is notably at high risk for preeclampsia given existing gestational hypertension and AMA.  However  I do suspect this is a side effect of the Procardia  given timing.  Will treat with Fioricet.  Will additionally obtain preeclampsia labs to further assess for possibility that this is preeclampsia with severe features.  She shows no signs of focal neurologic deficit or other red flag symptoms at this time.  1648: Reassessed patient at the bedside.  Does feel markedly improved after Fioricet.  Discussed with her that preeclampsia labs returned negative.  Additionally her blood pressure has normalized while in MAU and no longer is hypertensive.  Discussed that I have a high suspicion for a medication side effect causing her  headache.  However if headache does not resolve within the next 24 hours and/or her blood pressures rise again, she will return to care to reassess for transition from gestational hypertension into preeclampsia.  She has close follow-up scheduled in office 5/13.  FWB: Reactive NST.  Positive fetal movement  ASSESSMENT 1. Gestational hypertension without significant proteinuria in third trimester   2. Medication side effect   3. Pregnancy headache in third trimester   4. [redacted] weeks gestation of pregnancy      PLAN Discharge home with strict return precautions. Allergies as of 04/10/2024       Reactions   Procardia  Xl [nifedipine  Er] Other (See Comments)   Headache   Sulfa Antibiotics Itching        Medication List     STOP taking these medications    NIFEdipine  30 MG 24 hr tablet Commonly known as: Procardia  XL       TAKE these medications    aspirin  EC 81 MG tablet Take 1 tablet (81 mg total) by mouth at bedtime. Start taking when you are [redacted] weeks pregnant for rest of pregnancy for prevention of preeclampsia   Butalbital-APAP-Caffeine 50-325-40 MG capsule Take 1-2 capsules by mouth every 6 (six) hours as needed for headache.   fluticasone  50 MCG/ACT nasal spray Commonly known as: FLONASE  Place 1 spray into both nostrils daily.   labetalol 200 MG tablet Commonly known as: NORMODYNE Take 0.5 tablets (100 mg total) by mouth 2 (two) times daily.   loratadine 10 MG tablet Commonly known as: CLARITIN Take 10 mg by mouth daily.   meclizine 25 MG tablet Commonly known as: ANTIVERT Take 25 mg by mouth 3 (three) times daily as needed.   PRENATAL PO Take by mouth.         Authur Leghorn, MD OB Fellow 04/10/2024  4:56 PM

## 2024-04-12 ENCOUNTER — Ambulatory Visit: Admitting: Obstetrics and Gynecology

## 2024-04-12 ENCOUNTER — Other Ambulatory Visit (INDEPENDENT_AMBULATORY_CARE_PROVIDER_SITE_OTHER): Payer: Self-pay

## 2024-04-12 ENCOUNTER — Encounter: Payer: Self-pay | Admitting: Obstetrics and Gynecology

## 2024-04-12 ENCOUNTER — Ambulatory Visit: Admitting: *Deleted

## 2024-04-12 VITALS — BP 148/84 | HR 108 | Wt 228.0 lb

## 2024-04-12 DIAGNOSIS — Z8619 Personal history of other infectious and parasitic diseases: Secondary | ICD-10-CM

## 2024-04-12 DIAGNOSIS — O0990 Supervision of high risk pregnancy, unspecified, unspecified trimester: Secondary | ICD-10-CM

## 2024-04-12 DIAGNOSIS — O283 Abnormal ultrasonic finding on antenatal screening of mother: Secondary | ICD-10-CM | POA: Insufficient documentation

## 2024-04-12 DIAGNOSIS — Z3A34 34 weeks gestation of pregnancy: Secondary | ICD-10-CM | POA: Diagnosis not present

## 2024-04-12 DIAGNOSIS — O09529 Supervision of elderly multigravida, unspecified trimester: Secondary | ICD-10-CM | POA: Diagnosis not present

## 2024-04-12 DIAGNOSIS — O133 Gestational [pregnancy-induced] hypertension without significant proteinuria, third trimester: Secondary | ICD-10-CM

## 2024-04-12 DIAGNOSIS — O2342 Unspecified infection of urinary tract in pregnancy, second trimester: Secondary | ICD-10-CM | POA: Diagnosis not present

## 2024-04-12 DIAGNOSIS — B951 Streptococcus, group B, as the cause of diseases classified elsewhere: Secondary | ICD-10-CM

## 2024-04-12 MED ORDER — VALACYCLOVIR HCL 1 G PO TABS: 1000.0000 mg | ORAL_TABLET | Freq: Every day | ORAL | 0 refills | Status: DC

## 2024-04-12 NOTE — Progress Notes (Addendum)
 PRENATAL VISIT NOTE  Subjective:  Barbara Baker is a 43 y.o. G3P1011 at [redacted]w[redacted]d being seen today for ongoing prenatal care.  She is currently monitored for the following issues for this high-risk pregnancy and has Supervision of high risk pregnancy, antepartum; Maternal age 64+, multigravida, antepartum; GBS (group B streptococcus) in urine complicating pregnancy; Allergic rhinitis; Episodic cluster headache, not intractable; Prediabetes; Psoriasis; Marginal insertion of umbilical cord affecting management of mother; Gestational hypertension without significant proteinuria; and Abnormal fetal ultrasound on their problem list.  Patient reports wanting to follow up today to see about starting the labetalol recommended in MAU two days ago.  Contractions: Not present. Vag. Bleeding: None.  Movement: Present. Denies leaking of fluid.   The following portions of the patient's history were reviewed and updated as appropriate: allergies, current medications, past family history, past medical history, past social history, past surgical history and problem list.   Objective:   Vitals:   04/12/24 1538  BP: (!) 148/84  Pulse: (!) 108  Weight: 228 lb (103.4 kg)     Fetal Status: Fetal Heart Rate (bpm): NST   Movement: Present      General: Alert, oriented and cooperative. Patient is in no acute distress.  Skin: Skin is warm and dry. No rash noted.   Cardiovascular: Normal heart rate noted  Respiratory: Normal respiratory effort, no problems with respiration noted  Abdomen: Soft, gravid, appropriate for gestational age.  Pain/Pressure: Absent     Pelvic: Cervical exam deferred        Extremities: Normal range of motion.     Mental Status: Normal mood and affect. Normal behavior. Normal judgment and thought content.   Assessment and Plan:  Pregnancy: G3P1011 at [redacted]w[redacted]d 1. [redacted] weeks gestation of pregnancy (Primary) GBS negative, neg gc/ct on 4/27. Declines contraception  2. Gestational  hypertension without significant proteinuria in third trimester Seen on 5/11 for pre-eclampsia evaluation which was negative except for persistent mild range BPs. Labetalol recommended but hasn't started. I told her I recommend it at the dose they gave of 200 bid. Pt amenable to starting Patient set up for 5/31 at 2345 IOL Bpp 10/10. Continue with weekly testing. Repeat weekly labs next visit.  4/30: 83%, 2360g, ac 92%, afi wnl  3. Maternal age 71+, multigravida, antepartum Low risk mat21.   4. Group B Streptococcus urinary tract infection affecting pregnancy in second trimester Treat in labor  5. Supervision of high risk pregnancy, antepartum  6. Abnormal fetal ultrasound Recommend post natal u/s for liver calcifications  7. History of HSV Seen on torch labs. Valtrex ppx sent in   Preterm labor symptoms and general obstetric precautions including but not limited to vaginal bleeding, contractions, leaking of fluid and fetal movement were reviewed in detail with the patient. Please refer to After Visit Summary for other counseling recommendations.   No follow-ups on file.  Future Appointments  Date Time Provider Department Center  04/19/2024  3:10 PM [facility] NST [facility]  04/26/2024  3:50 PM Anyanwu, Kathrine Paris, MD [facility]  04/27/2024  4:00 PM [facility] US[facility]  05/01/2024 12:00 AM [facility] SCHED ROOM [facility] None  05/03/2024  3:10 PM [facility] NST [facility]  05/03/2024  3:30 PM Constant, Peggy, MD [facility]  05/10/2024  2:10 PM [facility] NST [facility]  05/10/2024  3:30 PM Granville Layer, MD [facility]  05/17/2024  2:10 PM [facility] NST [facility]  05/17/2024  3:30 PM Izell Marsh, MD [facility]  06/14/2024  8:30 AM Elta Halter, MD [facility] None    Raynell Caller, MD

## 2024-04-12 NOTE — Progress Notes (Signed)

## 2024-04-12 NOTE — Progress Notes (Signed)
 Has not started the labetalol yet, wanted to talk with her doctors first

## 2024-04-13 ENCOUNTER — Encounter: Payer: Self-pay | Admitting: Obstetrics & Gynecology

## 2024-04-13 ENCOUNTER — Encounter: Payer: Self-pay | Admitting: *Deleted

## 2024-04-19 ENCOUNTER — Ambulatory Visit: Admitting: *Deleted

## 2024-04-19 ENCOUNTER — Other Ambulatory Visit (INDEPENDENT_AMBULATORY_CARE_PROVIDER_SITE_OTHER)

## 2024-04-19 VITALS — BP 124/77 | HR 105 | Wt 231.0 lb

## 2024-04-19 DIAGNOSIS — Z3A35 35 weeks gestation of pregnancy: Secondary | ICD-10-CM

## 2024-04-19 DIAGNOSIS — O133 Gestational [pregnancy-induced] hypertension without significant proteinuria, third trimester: Secondary | ICD-10-CM

## 2024-04-19 DIAGNOSIS — O0990 Supervision of high risk pregnancy, unspecified, unspecified trimester: Secondary | ICD-10-CM

## 2024-04-19 NOTE — Progress Notes (Signed)

## 2024-04-20 LAB — COMPREHENSIVE METABOLIC PANEL WITH GFR
ALT: 10 IU/L (ref 0–32)
AST: 17 IU/L (ref 0–40)
Albumin: 3.6 g/dL — ABNORMAL LOW (ref 3.9–4.9)
Alkaline Phosphatase: 136 IU/L — ABNORMAL HIGH (ref 44–121)
BUN/Creatinine Ratio: 10 (ref 9–23)
BUN: 7 mg/dL (ref 6–24)
Bilirubin Total: 0.3 mg/dL (ref 0.0–1.2)
CO2: 17 mmol/L — ABNORMAL LOW (ref 20–29)
Calcium: 10.2 mg/dL (ref 8.7–10.2)
Chloride: 105 mmol/L (ref 96–106)
Creatinine, Ser: 0.69 mg/dL (ref 0.57–1.00)
Globulin, Total: 2.6 g/dL (ref 1.5–4.5)
Glucose: 97 mg/dL (ref 70–99)
Potassium: 4.2 mmol/L (ref 3.5–5.2)
Sodium: 137 mmol/L (ref 134–144)
Total Protein: 6.2 g/dL (ref 6.0–8.5)
eGFR: 112 mL/min/{1.73_m2} (ref >59)

## 2024-04-20 LAB — CBC
Hematocrit: 34.2 % (ref 34.0–46.6)
Hemoglobin: 11.7 g/dL (ref 11.1–15.9)
MCH: 30.4 pg (ref 26.6–33.0)
MCHC: 34.2 g/dL (ref 31.5–35.7)
MCV: 89 fL (ref 79–97)
Platelets: 235 10*3/uL (ref 150–450)
RBC: 3.85 x10E6/uL (ref 3.77–5.28)
RDW: 14.6 % (ref 11.7–15.4)
WBC: 6.3 10*3/uL (ref 3.4–10.8)

## 2024-04-20 LAB — PROTEIN / CREATININE RATIO, URINE
Creatinine, Urine: 45.5 mg/dL
Protein, Ur: 6.9 mg/dL
Protein/Creat Ratio: 152 mg/g{creat} (ref 0–200)

## 2024-04-21 ENCOUNTER — Ambulatory Visit: Payer: Self-pay | Admitting: Obstetrics & Gynecology

## 2024-04-24 ENCOUNTER — Encounter (HOSPITAL_COMMUNITY): Payer: Self-pay | Admitting: Family Medicine

## 2024-04-24 ENCOUNTER — Inpatient Hospital Stay (HOSPITAL_COMMUNITY)
Admission: AD | Admit: 2024-04-24 | Discharge: 2024-04-27 | DRG: 806 | Disposition: A | Discharge status: Home / Self Care | Attending: Obstetrics and Gynecology | Admitting: Obstetrics and Gynecology

## 2024-04-24 ENCOUNTER — Inpatient Hospital Stay (HOSPITAL_COMMUNITY)
Admission: AD | Admit: 2024-04-24 | Discharge: 2024-04-24 | Discharge status: Against Medical Advice | Attending: Family Medicine | Admitting: Family Medicine

## 2024-04-24 DIAGNOSIS — Z833 Family history of diabetes mellitus: Secondary | ICD-10-CM

## 2024-04-24 DIAGNOSIS — O133 Gestational [pregnancy-induced] hypertension without significant proteinuria, third trimester: Secondary | ICD-10-CM

## 2024-04-24 DIAGNOSIS — O42919 Preterm premature rupture of membranes, unspecified as to length of time between rupture and onset of labor, unspecified trimester: Secondary | ICD-10-CM

## 2024-04-24 DIAGNOSIS — A6000 Herpesviral infection of urogenital system, unspecified: Secondary | ICD-10-CM | POA: Diagnosis present

## 2024-04-24 DIAGNOSIS — O99824 Streptococcus B carrier state complicating childbirth: Secondary | ICD-10-CM | POA: Diagnosis present

## 2024-04-24 DIAGNOSIS — Z8249 Family history of ischemic heart disease and other diseases of the circulatory system: Secondary | ICD-10-CM

## 2024-04-24 DIAGNOSIS — O134 Gestational [pregnancy-induced] hypertension without significant proteinuria, complicating childbirth: Secondary | ICD-10-CM | POA: Diagnosis present

## 2024-04-24 DIAGNOSIS — D62 Acute posthemorrhagic anemia: Secondary | ICD-10-CM | POA: Diagnosis not present

## 2024-04-24 DIAGNOSIS — O4290 Premature rupture of membranes, unspecified as to length of time between rupture and onset of labor, unspecified weeks of gestation: Secondary | ICD-10-CM | POA: Insufficient documentation

## 2024-04-24 DIAGNOSIS — O9081 Anemia of the puerperium: Secondary | ICD-10-CM | POA: Diagnosis not present

## 2024-04-24 DIAGNOSIS — O42913 Preterm premature rupture of membranes, unspecified as to length of time between rupture and onset of labor, third trimester: Principal | ICD-10-CM | POA: Diagnosis present

## 2024-04-24 DIAGNOSIS — Z3A36 36 weeks gestation of pregnancy: Secondary | ICD-10-CM

## 2024-04-24 DIAGNOSIS — O9832 Other infections with a predominantly sexual mode of transmission complicating childbirth: Secondary | ICD-10-CM | POA: Diagnosis present

## 2024-04-24 LAB — RUPTURE OF MEMBRANE (ROM)PLUS: Rom Plus: POSITIVE

## 2024-04-24 NOTE — H&P (Incomplete)
 OBSTETRIC ADMISSION HISTORY AND PHYSICAL  Barbara Baker is a 43 y.o. female G3P1011 with IUP at [redacted]w[redacted]d by LMP presenting for IOL for PPROM. She reports +FMs, LOF beginning Friday, no VB. No vision changes, peripheral edema, or sharp abdominal pain.  She plans on breast & formula feeding. She requests *** for birth control *** is unsure of birth control method following delivery.  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 path pending   BREAST REDUCTION SURGERY Bilateral 06/20/2022   Procedure: MAMMARY REDUCTION  (BREAST);  Surgeon: Alger Infield, MD;  Location: [facility];  Service: Plastics;  Laterality: Bilateral;   DILATION AND CURETTAGE OF UTERUS      Obstetrical History: OB History     Gravida  3   Para  1   Term  1   Preterm      AB  1   Living  1      SAB  1   IAB      Ectopic      Multiple      Live Births  1        Obstetric Comments  2009  PTL, rec'd BMZ         Social History Social History   Socioeconomic History   Marital status: Single    Spouse name: Not on file   Number of children: Not on file   Years of education: Not on file   Highest education level: Not on file  Occupational History   Not on file  Tobacco Use   Smoking  status: Never   Smokeless tobacco: Never  Vaping Use   Vaping status: Never Used  Substance and Sexual Activity   Alcohol use: Never   Drug use: Never   Sexual activity: Yes  Other Topics Concern   Not on file  Social History Narrative   Not on file   Social Drivers of Health   Financial Resource Strain: Low Risk  (04/17/2023)   Received from [facility], [facility]   Overall Financial Resource Strain (CARDIA)    Difficulty of Paying Living Expenses: Not hard at all  Food Insecurity: No Food Insecurity (03/27/2024)   Hunger Vital Sign    Worried About Running Out of Food in the Last Year: Never true    Ran Out of Food in the Last Year: Never true  Transportation Needs: No Transportation Needs (03/27/2024)   PRAPARE - Administrator, Civil Service (Medical): No    Lack of Transportation (Non-Medical): No  Physical Activity: Not on file  Stress: Not on file  Social Connections: Not on file    Family History: Family History  Problem Relation Age of Onset  Ovarian cancer Mother 65   Asthma Mother    Sarcoidosis Mother    Other Mother        kidney issues   Heart failure Father    Diabetes Father    Hypertension Father    Psoriasis Son    Eczema Son     Allergies: Allergies  Allergen Reactions   Procardia  Xl [Nifedipine  Er] Other (See Comments)    Headache   Sulfa Antibiotics Itching    Medications Prior to Admission  Medication Sig Dispense Refill Last Dose/Taking   aspirin  EC 81 MG tablet Take 1 tablet (81 mg total) by mouth at bedtime. Start taking when you are [redacted] weeks pregnant for rest of pregnancy for prevention of preeclampsia 300 tablet 2 04/23/2024 Evening   labetalol  (NORMODYNE ) 200 MG tablet Take 0.5 tablets (100 mg total) by mouth 2 (two) times daily. 60 tablet 1 04/24/2024 Noon   loratadine (CLARITIN) 10 MG tablet Take 10 mg by mouth daily.   04/23/2024 Evening   Prenatal Vit-Fe Fumarate-FA (PRENATAL PO)  Take by mouth.   04/24/2024 Morning   Butalbital -APAP-Caffeine  50-325-40 MG capsule Take 1-2 capsules by mouth every 6 (six) hours as needed for headache. 15 capsule 0 Unknown   fluticasone  (FLONASE ) 50 MCG/ACT nasal spray Place 1 spray into both nostrils daily. 9.9 mL 2 Unknown   meclizine (ANTIVERT) 25 MG tablet Take 25 mg by mouth 3 (three) times daily as needed.   Unknown   valACYclovir  (VALTREX ) 1000 MG tablet Take 1 tablet (1,000 mg total) by mouth daily. 30 tablet 0 Unknown     Review of Systems  Per HPI  Blood pressure 123/73, pulse (!) 108, temperature 98.2 F (36.8 C), temperature source Oral, resp. rate 18, height 5\' 6"  (1.676 m), weight 104.7 kg, last menstrual period 08/14/2023. General appearance: {general exam:16600} Lungs: clear to auscultation bilaterally Heart: regular rate and rhythm Abdomen: soft, non-tender; bowel sounds normal Pelvic: *** Extremities: Homans sign is negative, no sign of DVT DTR's *** Presentation: {desc; fetal presentation:14558} Fetal monitoring{findings; monitor fetal heart monitor:31527} Uterine activity{Uterine contractions:31516} Dilation: 3 Effacement (%): 30 Station: Ballotable Exam by:: Mickeal Aland, RN   Prenatal labs: ABO, Rh: AB/Positive/-- (12/09 1542) Antibody: Negative (12/09 1542) Rubella: 2.17 (12/09 1542) RPR: Non Reactive (04/01 0811)  HBsAg: Negative (12/09 1542)  HIV: Non Reactive (04/01 0811)  GBS:   Urine culture with +GBS in 09/2023 1 hr Glucola: 173 Genetic screening:  Negative  Anatomy US : Female - normal   Prenatal Transfer Tool  Maternal Diabetes: No Genetic Screening: Normal Maternal Ultrasounds/Referrals: Fetal liver calcifications  Fetal Ultrasounds or other Referrals:  Recommend post-natal US  for liver calcifications  Maternal Substance Abuse:  No Significant Maternal Medications:  Labetalol , Valtrex , ASA Significant Maternal Lab Results: Urine culture with +GBS in 09/2023  Results for orders placed or  performed during the hospital encounter of 04/24/24 (from the past 24 hours)  Rupture of Membrane (ROM) Plus   Collection Time: 04/24/24  8:27 PM  Result Value Ref Range   Rom Plus POSITIVE     Patient Active Problem List   Diagnosis Date Noted   Abnormal fetal ultrasound 04/12/2024   History of herpes simplex infection 04/12/2024   Gestational hypertension without significant proteinuria 04/06/2024   Marginal insertion of umbilical cord affecting management of mother 12/23/2023   Psoriasis 12/09/2023   GBS (group B streptococcus) in urine complicating pregnancy 11/09/2023   Supervision of high risk pregnancy, antepartum 09/30/2023   Maternal age 75+, multigravida, antepartum 09/30/2023  Allergic rhinitis 04/03/2022   Episodic cluster headache, not intractable 03/09/2020   Prediabetes 06/02/2018    Assessment/Plan:  CARAN STORCK is a 43 y.o. 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

## 2024-04-24 NOTE — MAU Note (Signed)
..  Barbara Baker is a 43 y.o. at [redacted]w[redacted]d here in MAU reporting: leaking of fluid since Friday. Appears to be a mix of mucous plug and thin, clear discharge.   Occasional mild contractions. No bleeding. Endorses +FM.   Hx vag delivery x1. Gestational HTN on labetalol  with this pregnancy. +HA.   Denies\ vision changes, RUQ pain or swelling.   Pain score: 3/10 Vitals:   04/24/24 2003  BP: 123/73  Pulse: (!) 108  Resp: 18  Temp: 98.2 F (36.8 C)     FHT:148 Lab orders placed from triage:  Korene Pert

## 2024-04-25 ENCOUNTER — Inpatient Hospital Stay (HOSPITAL_COMMUNITY): Admitting: Anesthesiology

## 2024-04-25 ENCOUNTER — Encounter (HOSPITAL_COMMUNITY): Payer: Self-pay | Admitting: Family Medicine

## 2024-04-25 DIAGNOSIS — O4202 Full-term premature rupture of membranes, onset of labor within 24 hours of rupture: Secondary | ICD-10-CM | POA: Diagnosis not present

## 2024-04-25 DIAGNOSIS — A6000 Herpesviral infection of urogenital system, unspecified: Secondary | ICD-10-CM | POA: Diagnosis present

## 2024-04-25 DIAGNOSIS — O42913 Preterm premature rupture of membranes, unspecified as to length of time between rupture and onset of labor, third trimester: Secondary | ICD-10-CM | POA: Diagnosis present

## 2024-04-25 DIAGNOSIS — O42919 Preterm premature rupture of membranes, unspecified as to length of time between rupture and onset of labor, unspecified trimester: Principal | ICD-10-CM | POA: Diagnosis present

## 2024-04-25 DIAGNOSIS — O9081 Anemia of the puerperium: Secondary | ICD-10-CM | POA: Diagnosis not present

## 2024-04-25 DIAGNOSIS — O09523 Supervision of elderly multigravida, third trimester: Secondary | ICD-10-CM | POA: Diagnosis not present

## 2024-04-25 DIAGNOSIS — Z3A36 36 weeks gestation of pregnancy: Secondary | ICD-10-CM | POA: Diagnosis not present

## 2024-04-25 DIAGNOSIS — O134 Gestational [pregnancy-induced] hypertension without significant proteinuria, complicating childbirth: Secondary | ICD-10-CM | POA: Diagnosis present

## 2024-04-25 DIAGNOSIS — Z833 Family history of diabetes mellitus: Secondary | ICD-10-CM | POA: Diagnosis not present

## 2024-04-25 DIAGNOSIS — O99824 Streptococcus B carrier state complicating childbirth: Secondary | ICD-10-CM | POA: Diagnosis present

## 2024-04-25 DIAGNOSIS — O9832 Other infections with a predominantly sexual mode of transmission complicating childbirth: Secondary | ICD-10-CM | POA: Diagnosis present

## 2024-04-25 DIAGNOSIS — O99820 Streptococcus B carrier state complicating pregnancy: Secondary | ICD-10-CM | POA: Diagnosis not present

## 2024-04-25 DIAGNOSIS — Z8249 Family history of ischemic heart disease and other diseases of the circulatory system: Secondary | ICD-10-CM | POA: Diagnosis not present

## 2024-04-25 DIAGNOSIS — D62 Acute posthemorrhagic anemia: Secondary | ICD-10-CM | POA: Diagnosis not present

## 2024-04-25 LAB — CBC
HCT: 34.2 % — ABNORMAL LOW (ref 36.0–46.0)
HCT: 35.7 % — ABNORMAL LOW (ref 36.0–46.0)
Hemoglobin: 11.7 g/dL — ABNORMAL LOW (ref 12.0–15.0)
Hemoglobin: 12 g/dL (ref 12.0–15.0)
MCH: 29.6 pg (ref 26.0–34.0)
MCH: 29.7 pg (ref 26.0–34.0)
MCHC: 33.6 g/dL (ref 30.0–36.0)
MCHC: 34.2 g/dL (ref 30.0–36.0)
MCV: 86.8 fL (ref 80.0–100.0)
MCV: 88.1 fL (ref 80.0–100.0)
Platelets: 222 10*3/uL (ref 150–400)
Platelets: 230 10*3/uL (ref 150–400)
RBC: 3.94 MIL/uL (ref 3.87–5.11)
RBC: 4.05 MIL/uL (ref 3.87–5.11)
RDW: 14.8 % (ref 11.5–15.5)
RDW: 14.9 % (ref 11.5–15.5)
WBC: 7 10*3/uL (ref 4.0–10.5)
WBC: 8.2 10*3/uL (ref 4.0–10.5)
nRBC: 0 % (ref 0.0–0.2)
nRBC: 0 % (ref 0.0–0.2)

## 2024-04-25 LAB — TYPE AND SCREEN
ABO/RH(D): AB POS
Antibody Screen: NEGATIVE

## 2024-04-25 LAB — HIV ANTIBODY (ROUTINE TESTING W REFLEX): HIV Screen 4th Generation wRfx: NONREACTIVE

## 2024-04-25 LAB — RPR: RPR Ser Ql: NONREACTIVE

## 2024-04-25 MED ORDER — MEASLES, MUMPS & RUBELLA VAC IJ SOLR: 0.5000 mL | Freq: Once | INTRAMUSCULAR | Status: DC

## 2024-04-25 MED ORDER — BENZOCAINE-MENTHOL 20-0.5 % EX AERO
1.0000 | INHALATION_SPRAY | CUTANEOUS | Status: DC | PRN
  Administered 2024-04-25: 1 via TOPICAL
  Filled 2024-04-25: qty 56

## 2024-04-25 MED ORDER — IBUPROFEN 600 MG PO TABS
600.0000 mg | ORAL_TABLET | Freq: Four times a day (QID) | ORAL | Status: DC
  Administered 2024-04-25 – 2024-04-27 (×8): 600 mg via ORAL
  Filled 2024-04-25 (×8): qty 1

## 2024-04-25 MED ORDER — DIBUCAINE (PERIANAL) 1 % EX OINT: 1.0000 | TOPICAL_OINTMENT | CUTANEOUS | Status: DC | PRN

## 2024-04-25 MED ORDER — MISOPROSTOL 25 MCG QUARTER TABLET: 25.0000 ug | ORAL_TABLET | ORAL | Status: DC

## 2024-04-25 MED ORDER — MISOPROSTOL 25 MCG QUARTER TABLET
25.0000 ug | ORAL_TABLET | Freq: Once | ORAL | Status: AC
  Administered 2024-04-25: 25 ug via ORAL
  Filled 2024-04-25: qty 1

## 2024-04-25 MED ORDER — PHENYLEPHRINE 80 MCG/ML (10ML) SYRINGE FOR IV PUSH (FOR BLOOD PRESSURE SUPPORT): 80.0000 ug | PREFILLED_SYRINGE | INTRAVENOUS | Status: DC | PRN

## 2024-04-25 MED ORDER — LACTATED RINGERS IV SOLN: INTRAVENOUS | Status: DC

## 2024-04-25 MED ORDER — FERROUS SULFATE 325 (65 FE) MG PO TABS
325.0000 mg | ORAL_TABLET | ORAL | Status: DC
  Administered 2024-04-26: 325 mg via ORAL
  Filled 2024-04-25: qty 1

## 2024-04-25 MED ORDER — SENNOSIDES-DOCUSATE SODIUM 8.6-50 MG PO TABS
2.0000 | ORAL_TABLET | ORAL | Status: DC
  Administered 2024-04-26 – 2024-04-27 (×2): 2 via ORAL
  Filled 2024-04-25 (×2): qty 2

## 2024-04-25 MED ORDER — ONDANSETRON HCL 4 MG/2ML IJ SOLN: 4.0000 mg | Freq: Four times a day (QID) | INTRAMUSCULAR | Status: DC | PRN

## 2024-04-25 MED ORDER — FENTANYL-BUPIVACAINE-NACL 0.5-0.125-0.9 MG/250ML-% EP SOLN
12.0000 mL/h | EPIDURAL | Status: DC | PRN
  Administered 2024-04-25: 12 mL/h via EPIDURAL
  Filled 2024-04-25: qty 250

## 2024-04-25 MED ORDER — ONDANSETRON HCL 4 MG PO TABS: 4.0000 mg | ORAL_TABLET | ORAL | Status: DC | PRN

## 2024-04-25 MED ORDER — METHYLERGONOVINE MALEATE 0.2 MG/ML IJ SOLN: 0.2000 mg | INTRAMUSCULAR | Status: DC | PRN

## 2024-04-25 MED ORDER — LABETALOL HCL 200 MG PO TABS
200.0000 mg | ORAL_TABLET | Freq: Once | ORAL | Status: AC
  Administered 2024-04-25: 200 mg via ORAL
  Filled 2024-04-25: qty 1

## 2024-04-25 MED ORDER — MISOPROSTOL 25 MCG QUARTER TABLET: 25.0000 ug | ORAL_TABLET | Freq: Once | ORAL | Status: DC

## 2024-04-25 MED ORDER — FENTANYL CITRATE (PF) 100 MCG/2ML IJ SOLN
100.0000 ug | INTRAMUSCULAR | Status: DC | PRN
  Administered 2024-04-25: 100 ug via INTRAVENOUS
  Filled 2024-04-25: qty 2

## 2024-04-25 MED ORDER — PENICILLIN G POT IN DEXTROSE 60000 UNIT/ML IV SOLN
3.0000 10*6.[IU] | INTRAVENOUS | Status: DC
  Administered 2024-04-25 (×4): 3 10*6.[IU] via INTRAVENOUS
  Filled 2024-04-25 (×4): qty 50

## 2024-04-25 MED ORDER — TETANUS-DIPHTH-ACELL PERTUSSIS 5-2.5-18.5 LF-MCG/0.5 IM SUSY: 0.5000 mL | PREFILLED_SYRINGE | Freq: Once | INTRAMUSCULAR | Status: DC

## 2024-04-25 MED ORDER — OXYCODONE-ACETAMINOPHEN 5-325 MG PO TABS: 2.0000 | ORAL_TABLET | ORAL | Status: DC | PRN

## 2024-04-25 MED ORDER — LACTATED RINGERS IV SOLN: 500.0000 mL | INTRAVENOUS | Status: DC | PRN

## 2024-04-25 MED ORDER — LACTATED RINGERS IV SOLN
500.0000 mL | Freq: Once | INTRAVENOUS | Status: AC
  Administered 2024-04-25: 500 mL via INTRAVENOUS

## 2024-04-25 MED ORDER — OXYTOCIN-SODIUM CHLORIDE 30-0.9 UT/500ML-% IV SOLN
1.0000 m[IU]/min | INTRAVENOUS | Status: DC
  Administered 2024-04-25: 2 m[IU]/min via INTRAVENOUS
  Filled 2024-04-25: qty 500

## 2024-04-25 MED ORDER — OXYCODONE-ACETAMINOPHEN 5-325 MG PO TABS: 1.0000 | ORAL_TABLET | ORAL | Status: DC | PRN

## 2024-04-25 MED ORDER — OXYTOCIN BOLUS FROM INFUSION
333.0000 mL | Freq: Once | INTRAVENOUS | Status: AC
  Administered 2024-04-25: 333 mL via INTRAVENOUS

## 2024-04-25 MED ORDER — TERBUTALINE SULFATE 1 MG/ML IJ SOLN: 0.2500 mg | Freq: Once | INTRAMUSCULAR | Status: DC | PRN

## 2024-04-25 MED ORDER — BISACODYL 10 MG RE SUPP: 10.0000 mg | Freq: Every day | RECTAL | Status: DC | PRN

## 2024-04-25 MED ORDER — DIPHENHYDRAMINE HCL 25 MG PO CAPS: 25.0000 mg | ORAL_CAPSULE | Freq: Four times a day (QID) | ORAL | Status: DC | PRN

## 2024-04-25 MED ORDER — SOD CITRATE-CITRIC ACID 500-334 MG/5ML PO SOLN: 30.0000 mL | ORAL | Status: DC | PRN

## 2024-04-25 MED ORDER — LIDOCAINE HCL (PF) 1 % IJ SOLN: 30.0000 mL | INTRAMUSCULAR | Status: DC | PRN

## 2024-04-25 MED ORDER — EPHEDRINE 5 MG/ML INJ: 10.0000 mg | INTRAVENOUS | Status: DC | PRN

## 2024-04-25 MED ORDER — FUROSEMIDE 20 MG PO TABS
20.0000 mg | ORAL_TABLET | Freq: Every day | ORAL | Status: DC
  Administered 2024-04-26: 20 mg via ORAL
  Filled 2024-04-25: qty 1

## 2024-04-25 MED ORDER — PRENATAL MULTIVITAMIN CH
1.0000 | ORAL_TABLET | Freq: Every day | ORAL | Status: DC
  Administered 2024-04-26 – 2024-04-27 (×2): 1 via ORAL
  Filled 2024-04-25 (×2): qty 1

## 2024-04-25 MED ORDER — METHYLERGONOVINE MALEATE 0.2 MG PO TABS: 0.2000 mg | ORAL_TABLET | ORAL | Status: DC | PRN

## 2024-04-25 MED ORDER — ACETAMINOPHEN 325 MG PO TABS: 650.0000 mg | ORAL_TABLET | ORAL | Status: DC | PRN

## 2024-04-25 MED ORDER — MEDROXYPROGESTERONE ACETATE 150 MG/ML IM SUSP: 150.0000 mg | INTRAMUSCULAR | Status: DC | PRN

## 2024-04-25 MED ORDER — SIMETHICONE 80 MG PO CHEW: 80.0000 mg | CHEWABLE_TABLET | ORAL | Status: DC | PRN

## 2024-04-25 MED ORDER — FLEET ENEMA RE ENEM: 1.0000 | ENEMA | Freq: Every day | RECTAL | Status: DC | PRN

## 2024-04-25 MED ORDER — COCONUT OIL OIL: 1.0000 | TOPICAL_OIL | Status: DC | PRN

## 2024-04-25 MED ORDER — SODIUM CHLORIDE 0.9 % IV SOLN
5.0000 10*6.[IU] | Freq: Once | INTRAVENOUS | Status: AC
  Administered 2024-04-25: 5 10*6.[IU] via INTRAVENOUS
  Filled 2024-04-25: qty 5

## 2024-04-25 MED ORDER — LIDOCAINE HCL (PF) 1 % IJ SOLN
INTRAMUSCULAR | Status: DC | PRN
  Administered 2024-04-25 (×2): 4 mL via EPIDURAL

## 2024-04-25 MED ORDER — ONDANSETRON HCL 4 MG/2ML IJ SOLN: 4.0000 mg | INTRAMUSCULAR | Status: DC | PRN

## 2024-04-25 MED ORDER — LABETALOL HCL 200 MG PO TABS
200.0000 mg | ORAL_TABLET | Freq: Two times a day (BID) | ORAL | Status: DC
  Administered 2024-04-26 – 2024-04-27 (×3): 200 mg via ORAL
  Filled 2024-04-25 (×3): qty 1

## 2024-04-25 MED ORDER — ACETAMINOPHEN 325 MG PO TABS
650.0000 mg | ORAL_TABLET | ORAL | Status: DC | PRN
  Administered 2024-04-25: 650 mg via ORAL
  Filled 2024-04-25: qty 2

## 2024-04-25 MED ORDER — WITCH HAZEL-GLYCERIN EX PADS: 1.0000 | MEDICATED_PAD | CUTANEOUS | Status: DC | PRN

## 2024-04-25 MED ORDER — DIPHENHYDRAMINE HCL 50 MG/ML IJ SOLN: 12.5000 mg | INTRAMUSCULAR | Status: DC | PRN

## 2024-04-25 MED ORDER — OXYTOCIN-SODIUM CHLORIDE 30-0.9 UT/500ML-% IV SOLN
2.5000 [IU]/h | INTRAVENOUS | Status: DC
  Administered 2024-04-25: 2.5 [IU]/h via INTRAVENOUS

## 2024-04-25 NOTE — Anesthesia Preprocedure Evaluation (Signed)
 Anesthesia Evaluation  Patient identified by MRN, date of birth, ID band Patient awake    Reviewed: Allergy & Precautions, NPO status , Patient's Chart, lab work & pertinent test results  History of Anesthesia Complications Negative for: history of anesthetic complications  Airway Mallampati: III  TM Distance: >3 FB Neck ROM: Full    Dental   Pulmonary neg pulmonary ROS   Pulmonary exam normal breath sounds clear to auscultation       Cardiovascular hypertension (gestational), Pt. on home beta blockers  Rhythm:Regular Rate:Normal     Neuro/Psych  Headaches, neg Seizures    GI/Hepatic negative GI ROS, Neg liver ROS,,,  Endo/Other  Pre-diabetes  Renal/GU negative Renal ROS     Musculoskeletal   Abdominal  (+) + obese  Peds  Hematology negative hematology ROS (+) Lab Results      Component                Value               Date                      WBC                      8.2                 04/25/2024                HGB                      12.0                04/25/2024                HCT                      35.7 (L)            04/25/2024                MCV                      88.1                04/25/2024                PLT                      222                 04/25/2024              Anesthesia Other Findings Takes baby aspirin   Psoriasis   Reproductive/Obstetrics (+) Pregnancy                             Anesthesia Physical Anesthesia Plan  ASA: 2  Anesthesia Plan: Epidural   Post-op Pain Management:    Induction:   PONV Risk Score and Plan:   Airway Management Planned: Natural Airway  Additional Equipment:   Intra-op Plan:   Post-operative Plan:   Informed Consent: I have reviewed the patients History and Physical, chart, labs and discussed the procedure including the risks, benefits and alternatives for the proposed anesthesia with the patient or authorized  representative who has indicated his/her understanding and acceptance.  Plan Discussed with: Anesthesiologist  Anesthesia Plan Comments: (I have discussed risks of neuraxial anesthesia including but not limited to infection, bleeding, nerve injury, back pain, headache, seizures, and failure of block. Patient denies bleeding disorders and is not currently anticoagulated. Labs have been reviewed. Risks and benefits discussed. All patient's questions answered.  )       Anesthesia Quick Evaluation

## 2024-04-25 NOTE — Progress Notes (Signed)
 Patient Vitals for the past 4 hrs:  BP Temp Temp src Pulse Resp SpO2  04/25/24 1110 135/85 -- -- 100 -- --  04/25/24 1055 (!) 164/87 -- -- 100 -- --  04/25/24 0927 135/73 98.2 F (36.8 C) Oral 96 18 96 %   Just woke up from a nap. FHR 135. Cat 1.  Cx 5/60/-2.  Ctx q 6-7 minutes. IUPC placed by Andree Kayser, SNM.  Will start pitocin (pt wants to eat a light laboring diet first).

## 2024-04-25 NOTE — Progress Notes (Addendum)
 Patient ID: Barbara Baker, female   DOB: [birth year], 43 y.o.   MRN: [id number] Called by RN for Increased blood pressure  Vitals:   04/25/24 1810 04/25/24 1820 04/25/24 1834 04/25/24 1847  BP: 127/80 (!) 141/63 (!) 144/75 95/75   04/25/24 1900 04/25/24 1915 04/25/24 1930 04/25/24 1948  BP: 126/69 (!) 144/76 (!) 150/71 (!) 143/74   04/25/24 2002 04/25/24 2015 04/25/24 2038 04/25/24 2048  BP: (!) 144/72 (!) 143/77 (!) 156/79 (!) 155/88   Last had Labetalol  at 0059hrs last night  Will give a dose of Labetalol  200mg  now then resume BID Labetalol  tomorrow and add Lasix for 5 days.    Does not tolerate NIfedipine , so will stick with Labetalol   Barbara Baker, CNM

## 2024-04-25 NOTE — Progress Notes (Signed)
 Report called to Bobbye Burrow Jullie Oiler) Alva Jewels RN prior to transfer

## 2024-04-25 NOTE — H&P (Addendum)
 OBSTETRIC ADMISSION HISTORY AND PHYSICAL  Barbara Baker is a 43 y.o. female G3P1011 with IUP at [redacted]w[redacted]d by LMP presenting for IOL for PPROM. She reports +FMs, LOF (mucous plug, clear discharge) beginning Friday, no VB. No vision changes, peripheral edema, or sharp abdominal pain. Reports mild contractions. She plans on breast & formula feeding. She requests is unsure of birth control method following delivery, considering IUD vs abstinence.  She received her prenatal care at [facility]   Dating: By LMP --->  Estimated Date of Delivery: 05/20/24  Sono:   @[redacted]w[redacted]d , CWD, liver calcifications seen, cephalic presentation,  2360g, 83% EFW   Prenatal History/Complications:  - gHTN: on Labetalol  100 BID, no significant proteinuria, negative pre-e eval on 5/11 - AMA: low risk mat21 - GBS UTI: plan to treat in labor  - Abnormal fetal US  with liver calcifications  - Hx HSV: Did not take Valtrex , no recent lesions reported - Hx prediabetes: Last A1c 5.6 in 11/2023  Past Medical History: Past Medical History:  Diagnosis Date   Dermatitis    Seborrheic dermatitis   Headache    Psoriasis     Past Surgical History: Past Surgical History:  Procedure Laterality Date   BREAST BIOPSY Left 04/30/2022   stereo biopsy/x clip. path pending   BREAST REDUCTION SURGERY Bilateral 06/20/2022   Procedure: MAMMARY REDUCTION  (BREAST);  Surgeon: Alger Infield, MD;  Location: [facility];  Service: Plastics;  Laterality: Bilateral;   DILATION AND CURETTAGE OF UTERUS      Obstetrical History: OB History     Gravida  3   Para  1   Term  1   Preterm      AB  1   Living  1      SAB  1   IAB      Ectopic      Multiple      Live Births  1        Obstetric Comments  2009  PTL, rec'd BMZ         Social History Social History   Socioeconomic History   Marital status: Single    Spouse name: Not on file   Number of children: Not on file   Years of education: Not on file    Highest education level: Not on file  Occupational History   Not on file  Tobacco Use   Smoking status: Never   Smokeless tobacco: Never  Vaping Use   Vaping status: Never Used  Substance and Sexual Activity   Alcohol use: Never   Drug use: Never   Sexual activity: Yes  Other Topics Concern   Not on file  Social History Narrative   Not on file   Social Drivers of Health   Financial Resource Strain: Low Risk  (04/17/2023)   Received from [facility], [facility]   Overall Financial Resource Strain (CARDIA)    Difficulty of Paying Living Expenses: Not hard at all  Food Insecurity: No Food Insecurity (03/27/2024)   Hunger Vital Sign    Worried About Running Out of Food in the Last Year: Never true    Ran Out of Food in the Last Year: Never true  Transportation Needs: No Transportation Needs (03/27/2024)   PRAPARE - Administrator, Civil Service (Medical): No    Lack of Transportation (Non-Medical): No  Physical Activity: Not on file  Stress: Not on file  Social Connections: Not on file  Family History: Family History  Problem Relation Age of Onset   Ovarian cancer Mother 44   Asthma Mother    Sarcoidosis Mother    Other Mother        kidney issues   Heart failure Father    Diabetes Father    Hypertension Father    Psoriasis Son    Eczema Son     Allergies: Allergies  Allergen Reactions   Procardia  Xl [Nifedipine  Er] Other (See Comments)    Headache   Sulfa Antibiotics Itching    Medications Prior to Admission  Medication Sig Dispense Refill Last Dose/Taking   aspirin  EC 81 MG tablet Take 1 tablet (81 mg total) by mouth at bedtime. Start taking when you are [redacted] weeks pregnant for rest of pregnancy for prevention of preeclampsia 300 tablet 2    Butalbital -APAP-Caffeine  50-325-40 MG capsule Take 1-2 capsules by mouth every 6 (six) hours as needed for headache. 15 capsule 0    fluticasone  (FLONASE ) 50 MCG/ACT  nasal spray Place 1 spray into both nostrils daily. 9.9 mL 2    labetalol  (NORMODYNE ) 200 MG tablet Take 0.5 tablets (100 mg total) by mouth 2 (two) times daily. 60 tablet 1    loratadine (CLARITIN) 10 MG tablet Take 10 mg by mouth daily.      meclizine (ANTIVERT) 25 MG tablet Take 25 mg by mouth 3 (three) times daily as needed.      Prenatal Vit-Fe Fumarate-FA (PRENATAL PO) Take by mouth.      valACYclovir  (VALTREX ) 1000 MG tablet Take 1 tablet (1,000 mg total) by mouth daily. 30 tablet 0      Review of Systems  Per HPI  Last menstrual period 08/14/2023. General appearance: alert, cooperative, and no distress Lungs: CTAB anteriorly. Normal WOB on RA.  Heart: Regular rate and rhythm. Warm and well-perfused.  Abdomen: Soft, non-tender Extremities: BLE without erythema, edema, or tenderness.  Presentation: cephalic Fetal monitoring: Baseline: 145 bpm, Variability: Good {> 6 bpm), Accelerations: Reactive, and Decelerations: Absent Uterine activity: Contractions variable    Prenatal labs: ABO, Rh: --/--/PENDING (05/26 0002) Antibody: PENDING (05/26 0002) Rubella: 2.17 (12/09 1542) RPR: Non Reactive (04/01 0811)  HBsAg: Negative (12/09 1542)  HIV: Non Reactive (04/01 0811)  GBS:   Urine culture with +GBS in 09/2023 1 hr Glucola: 173 Genetic screening:  Negative  Anatomy US : Female - normal   Prenatal Transfer Tool  Maternal Diabetes: No Genetic Screening: Normal Maternal Ultrasounds/Referrals: Fetal liver calcifications  Fetal Ultrasounds or other Referrals:  Recommend post-natal US  for liver calcifications  Maternal Substance Abuse:  No Significant Maternal Medications:  Labetalol , Valtrex , ASA Significant Maternal Lab Results: Urine culture with +GBS in 09/2023  Results for orders placed or performed during the hospital encounter of 04/24/24 (from the past 24 hours)  CBC   Collection Time: 04/25/24 12:02 AM  Result Value Ref Range   WBC 7.0 4.0 - 10.5 K/uL   RBC 3.94 3.87 -  5.11 MIL/uL   Hemoglobin 11.7 (L) 12.0 - 15.0 g/dL   HCT 40.9 (L) 81.1 - 91.4 %   MCV 86.8 80.0 - 100.0 fL   MCH 29.7 26.0 - 34.0 pg   MCHC 34.2 30.0 - 36.0 g/dL   RDW 78.2 95.6 - 21.3 %   Platelets 230 150 - 400 K/uL   nRBC 0.0 0.0 - 0.2 %  Type and screen [facility]   Collection Time: 04/25/24 12:02 AM  Result Value Ref Range   ABO/RH(D) PENDING  Antibody Screen PENDING    Sample Expiration      04/28/2024,2359 Performed at [facility], [address] [place], [place] [address]   Results for orders placed or performed during the hospital encounter of 04/24/24 (from the past 24 hours)  Rupture of Membrane (ROM) Plus   Collection Time: 04/24/24  8:27 PM  Result Value Ref Range   Rom Plus POSITIVE     Patient Active Problem List   Diagnosis Date Noted   Preterm premature rupture of membranes (PPROM) with unknown onset of labor 04/25/2024   Abnormal fetal ultrasound 04/12/2024   History of herpes simplex infection 04/12/2024   Gestational hypertension without significant proteinuria 04/06/2024   Marginal insertion of umbilical cord affecting management of mother 12/23/2023   Psoriasis 12/09/2023   GBS (group B streptococcus) in urine complicating pregnancy 11/09/2023   Supervision of high risk pregnancy, antepartum 09/30/2023   Maternal age 52+, multigravida, antepartum 09/30/2023   Allergic rhinitis 04/03/2022   Episodic cluster headache, not intractable 03/09/2020   Prediabetes 06/02/2018    Assessment/Plan:  Barbara Baker is a 43 y.o. G3P1011 at [redacted]w[redacted]d here for IOL for PPROM   #Labor: IOL for PPROM  #Pain: As requested, prefers as little analgesic as possible  #FWB: Cat 1 #ID:  GBS+ in urine early in pregnancy, no GBS testing @36  weeks - will treat with penicillin q4 #MOF: Breast & Formula #MOC: Unsure, considering IUD vs abstinence  #Circ:  Desired  #gHTN: Elevated to 154/79, will give Labetalol  200  #Hx HSV: Did not take  Valtrex , will spec exam   Carey Chapman, MD  Center for [facility], [facility] 04/25/2024, 12:39 AM  Attestation:  I confirm that I have verified the information documented in the resident's note and that I have also personally reperformed the physical exam and all medical decision making activities.   The patient was seen and examined by me also Agree with note NST reactive and reassuring UCs as listed Cervical exams as listed in note  Harlee Lichtenstein, CNM

## 2024-04-25 NOTE — Lactation Note (Signed)
 This note was copied from a baby's chart. Lactation Consultation Note  Patient Name: Barbara Baker [name] Date: 04/25/2024 Age:41 hours Reason for consult: Initial assessment;Late-preterm 34-36.6wks Mom holding baby. Discussed mom pumping and is in agreement. Mom plans on pumping, BF, and bottle feeding (BM/formula). Mom is giving formula until her milk supply comes in. Mom had breast reduction. Noted thick Keloid scarring around edge of areola. Nipple compressible.  Mom didn't BF her now 50 yr old son.  Mom stated baby took formula w/o difficulty. Mom is going to call for [facility] the next time baby latches and is BF on the breast. Newborn feeding habits, STS, I&O, milk storage reviewed. Mom encouraged to feed baby 8-12 times/24 hours and with feeding cues.  Reviewed time limit for feeding LPI and supplementing. Discussed mom pumping and is in agreement. Assisted w/pumping, held on pump at one of the breast while she pumps because she is holding baby and didn't want to pu him down d/t lab coming soon. Mom shown how to use DEBP & how to disassemble, clean, & reassemble parts. Encouraged to call for assistance.  Maternal Data Has patient been taught Hand Expression?: Yes Does the patient have breastfeeding experience prior to this delivery?: No  Feeding Nipple Type: Slow - flow  LATCH Score                    Lactation Tools Discussed/Used Tools: Pump;Flanges Flange Size: 18 Breast pump type: Double-Electric Breast Pump Pump Education: Setup, frequency, and cleaning;Milk Storage Reason for Pumping: LPI Pumping frequency: q 3hr Pumped volume:  (drops)  Interventions Interventions: Breast feeding basics reviewed;Skin to skin;Hand express;Pre-pump if needed;Breast compression;Expressed milk;DEBP;Education;LC Services brochure  Discharge    Consult Status Consult Status: Follow-up Date: 04/26/24 Follow-up type: In-patient    Morghan Kester G 04/25/2024, 11:24  PM

## 2024-04-25 NOTE — Progress Notes (Signed)
 Patient Vitals for the past 4 hrs:  BP Temp Temp src Pulse  04/25/24 1630 (!) 146/88 -- -- 92  04/25/24 1600 (!) 142/79 -- -- 92  04/25/24 1530 (!) 143/82 97.8 F (36.6 C) Oral 98  04/25/24 1515 (!) 144/81 -- -- 96  04/25/24 1430 133/78 -- -- 93  04/25/24 1400 125/73 -- -- 97  04/25/24 1334 129/64 -- -- 97  04/25/24 1302 132/71 -- -- 98   Ctx still mild, some stronger than others.  IUPC wasn't functioning correctly and bothered pt, so was removed.  Cx 5/60/-2, forebag felt.  AROM w/clear fluid.  Pitocin @ 8 mu/min, ctx q 3-4 minutes. Pt declines another IUPC.  Continue to increase pitocin until ctx are stronger and cx starts changing.

## 2024-04-25 NOTE — Progress Notes (Signed)
 The Rn called [facility] regarding patient's increase Blood pressures. The provider will order labetalol  now and replace with procardia  in the am.  BP parameters to call provider are 160/105.

## 2024-04-25 NOTE — Discharge Summary (Signed)
 Postpartum Discharge Summary  Date of Service updated***     Patient Name: Barbara Baker DOB: 06-Oct-1981 MRN: [id number]  Date of admission: 04/24/2024 Delivery date:04/25/2024 Delivering provider: Ida Mains E Date of discharge: 04/25/2024  Admitting diagnosis: Preterm premature rupture of membranes (PPROM) with unknown onset of labor [O42.919] Intrauterine pregnancy: [redacted]w[redacted]d     Secondary diagnosis:  Principal Problem:   Preterm premature rupture of membranes (PPROM) with unknown onset of labor Active Problems:   Vaginal delivery  Additional problems: ***    Discharge diagnosis: Preterm Pregnancy Delivered and Gestational Hypertension                                              Post partum procedures:{Postpartum procedures:23558} Augmentation: AROM, Pitocin, and Cytotec Complications: None  Hospital course: Induction of Labor With Vaginal Delivery   43 y.o. yo G3P1011 at [redacted]w[redacted]d was admitted to the hospital 04/24/2024 for induction of labor.  Indication for induction: PROM.  Patient had an labor course complicated by nothing Membrane Rupture Time/Date: 11:00 AM,04/22/2024  Delivery Method:Vaginal, Spontaneous Operative Delivery:N/A Episiotomy: None Lacerations:  None Details of delivery can be found in separate delivery note.  Patient had a postpartum course complicated by***. Patient is discharged home 04/25/24.  Newborn Data: Birth date:04/25/2024 Birth time:6:34 PM Gender:Female Living status:Living Apgars:8 ,9  Weight:   Magnesium Sulfate received: No BMZ received: No Rhophylac:N/A MMR:N/A T-DaP:Given prenatally Flu: N/A RSV Vaccine received: No Transfusion:{Transfusion received:30440034}  Immunizations received: Immunization History  Administered Date(s) Administered   Influenza, Seasonal, Injecte, Preservative Fre 01/05/2024   Influenza,inj,Quad PF,6+ Mos 09/16/2018, 09/11/2020   Influenza-Unspecified 10/01/2014, 12/24/2022   PFIZER(Purple  Top)SARS-COV-2 Vaccination 06/23/2020, 07/16/2020   Tdap 03/01/2024    Physical exam  Vitals:   04/25/24 1745 04/25/24 1750 04/25/24 1800 04/25/24 1834  BP: (!) 155/85 (!) 159/78  (!) 144/75  Pulse: 88 93  97  Resp:      Temp:      TempSrc:      SpO2: 99% 99% 98% 99%   General: {Exam; general:21111117} Lochia: {Desc; appropriate/inappropriate:30686::"appropriate"} Uterine Fundus: {Desc; firm/soft:30687} Incision: {Exam; incision:21111123} DVT Evaluation: {Exam; dvt:2111122} Labs: Lab Results  Component Value Date   WBC 8.2 04/25/2024   HGB 12.0 04/25/2024   HCT 35.7 (L) 04/25/2024   MCV 88.1 04/25/2024   PLT 222 04/25/2024      Latest Ref Rng & Units 04/19/2024    3:41 PM  CMP  Glucose 70 - 99 mg/dL 97   BUN 6 - 24 mg/dL 7   Creatinine 4.09 - 8.11 mg/dL 9.14   Sodium 782 - 956 mmol/L 137   Potassium 3.5 - 5.2 mmol/L 4.2   Chloride 96 - 106 mmol/L 105   CO2 20 - 29 mmol/L 17   Calcium 8.7 - 10.2 mg/dL 21.3   Total Protein 6.0 - 8.5 g/dL 6.2   Total Bilirubin 0.0 - 1.2 mg/dL 0.3   Alkaline Phos 44 - 121 IU/L 136   AST 0 - 40 IU/L 17   ALT 0 - 32 IU/L 10    Edinburgh Score:     No data to display         No data recorded  After visit meds:  Allergies as of 04/25/2024       Reactions   Procardia  Xl [nifedipine  Er] Other (See Comments)   Headache  Sulfa Antibiotics Itching     Med Rec must be completed prior to using this [facility]***        Discharge home in stable condition Infant Feeding: {Baby feeding:23562} Infant Disposition:{CHL IP OB HOME WITH ZOXWRU:04540} Discharge instruction: per After Visit Summary and Postpartum booklet. Activity: Advance as tolerated. Pelvic rest for 6 weeks.  Diet: {OB JWJX:[id number]} Future Appointments: Future Appointments  Date Time Provider Department Center  04/26/2024  3:50 PM Julianne Octave, MD [facility]  04/27/2024  4:00 PM [facility]  05/01/2024 12:00 AM [facility] SCHED  ROOM [facility] None  06/14/2024  8:30 AM Elta Halter, MD [facility] None   Follow up Visit:   Please schedule this patient for a In person postpartum visit in 4 weeks with the following provider: Any provider. Additional Postpartum F/U:BP check 1 week  Low risk pregnancy complicated by: HTN Delivery mode:  Vaginal, Spontaneous Anticipated Birth Control:  IUD   04/25/2024 Majel Scott, CNM

## 2024-04-25 NOTE — Anesthesia Procedure Notes (Signed)
 Epidural Patient location during procedure: OB Start time: 04/25/2024 5:17 PM End time: 04/25/2024 5:22 PM  Staffing Anesthesiologist: Conard Decent, MD Performed: anesthesiologist   Preanesthetic Checklist Completed: patient identified, IV checked, site marked, risks and benefits discussed, surgical consent, monitors and equipment checked, pre-op evaluation and timeout performed  Epidural Patient position: sitting Prep: DuraPrep and site prepped and draped Patient monitoring: continuous pulse ox and blood pressure Approach: midline Location: L3-L4 Injection technique: LOR saline  Needle:  Needle type: Tuohy  Needle gauge: 17 G Needle length: 9 cm and 9 Needle insertion depth: 7 cm Catheter type: closed end flexible Catheter size: 19 Gauge Catheter at skin depth: 12 cm Test dose: negative  Assessment Events: blood not aspirated, no cerebrospinal fluid, injection not painful, no injection resistance, no paresthesia and negative IV test  Additional Notes The patient has requested an epidural for labor pain management. Risks and benefits including, but not limited to, infection, bleeding, local anesthetic toxicity, headache, hypotension, back pain, block failure, etc. were discussed with the patient. The patient expressed understanding and consented to the procedure. I confirmed that the patient has no bleeding disorders and is not taking blood thinners. I confirmed the patient's last platelet count with the nurse. A time-out was performed immediately prior to the procedure. Please see nursing documentation for vital signs. Sterile technique was used throughout the whole procedure. Once LOR achieved, the epidural catheter threaded easily without resistance. Aspiration of the catheter was negative for blood and CSF. The epidural was dosed slowly and an infusion was started.  1 attempt(s)Reason for block:procedure for pain

## 2024-04-25 NOTE — Progress Notes (Signed)
 Patient ID: Barbara Baker, female   DOB: October 30, 1981, 43 y.o.   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

## 2024-04-26 ENCOUNTER — Other Ambulatory Visit

## 2024-04-26 ENCOUNTER — Encounter: Admitting: Obstetrics & Gynecology

## 2024-04-26 NOTE — Lactation Note (Signed)
 This note was copied from a baby's chart. Lactation Consultation Note  Patient Name: Barbara Baker [name] Date: 04/26/2024 Age:41 hours Reason for consult: Follow-up assessment;1st time breastfeeding;Late-preterm 34-36.6wks;Breast reduction (H/O HSV, breast reduction in 2023)  P2- Infant was born at [redacted]w[redacted]d GA and weighed 3490g. MOB plans to offer both breast milk and formula. MOB has h/o a bilateral breast reduction in 2023. MOB has been set up with the hospital DEBP to help stimulate her breasts. When asked how feedings were going, MOB reports that she forgot about pumping and has not latched infant. MOB reports bottle feeding formula only so far. MOB also reports that infant took the bottle well yesterday, but now he is struggling to take the bottle. LC reviewed how this is typical for 36 week infants. Day 1 they do really well, day 2 they often decrease their productivity and day 3 they tend to start doing really well again. LC encouraged MOB to call out for bottling assistance if she needs by help tonight. LC stressed the importance of pumping to protect her supply if she does want to offer breast milk. LC also reviewed pace feeding with the bottle. LC encouraged MOB to call for further assistance as needed.  Maternal Data Does the patient have breastfeeding experience prior to this delivery?: No  Feeding Mother's Current Feeding Choice: Breast Milk and Formula  Lactation Tools Discussed/Used Tools: Pump;Flanges Breast pump type: Double-Electric Breast Pump;Manual Pump Education: Setup, frequency, and cleaning;Milk Storage Reason for Pumping: LPI infant, H/O breast reduction Pumping frequency: 15-20 min every 3 hrs  Interventions Interventions: Breast feeding basics reviewed;Hand pump;DEBP;Education;Pace feeding;LC Services brochure;LPT handout/interventions  Discharge Discharge Education: Engorgement and breast care;Warning signs for feeding baby  Consult Status Consult  Status: Follow-up Date: 04/27/24 Follow-up type: In-patient    Vernette Goo BS, IBCLC 04/26/2024, 8:11 PM

## 2024-04-26 NOTE — Progress Notes (Signed)
 POSTPARTUM PROGRESS NOTE  Post Partum Day 1  Subjective:  Barbara Baker is a 43 y.o. [id number] s/p SVD at [redacted]w[redacted]d.  She reports she is doing well. No acute events overnight. She denies any problems with ambulating, voiding or po intake. Denies nausea or vomiting.  Pain is moderately controlled.  Lochia is appropriate.  Objective: Blood pressure 133/79, pulse 96, temperature 98.4 F (36.9 C), temperature source Oral, resp. rate 16, last menstrual period 08/14/2023, SpO2 99%, unknown if currently breastfeeding.  Physical Exam:  General: alert, cooperative and no distress Chest: no respiratory distress Heart:regular rate, distal pulses intact Uterine Fundus: firm, appropriately tender DVT Evaluation: No calf swelling or tenderness Extremities: trace edema Skin: warm, dry  Recent Labs    04/25/24 0002 04/25/24 1655  HGB 11.7* 12.0  HCT 34.2* 35.7*    Assessment/Plan: BRIENA SWINGLER is a 43 y.o. [id number] s/p SVD at [redacted]w[redacted]d   PPD#1 - Doing well  Routine postpartum care #GHTN - asymptomatic, mild range BP today  Start Labetalol  200mg  BID, Lasix and Potassium #ABLA - expected post partum loss, asymptomatic  Continue PO iron supplementation  Contraception: Mirena out patient Feeding: Formula Dispo: Plan for discharge tomorrow.   LOS: 1 day   Darrow End, MD OB Fellow  04/26/2024, 11:05 AM

## 2024-04-26 NOTE — Anesthesia Postprocedure Evaluation (Signed)
 Anesthesia Post Note  Patient: Barbara Baker  Procedure(s) Performed: AN AD HOC LABOR EPIDURAL     Patient location during evaluation: Mother Baby Anesthesia Type: Epidural Level of consciousness: awake, oriented and awake and alert Pain management: pain level controlled Vital Signs Assessment: post-procedure vital signs reviewed and stable Respiratory status: spontaneous breathing, nonlabored ventilation and respiratory function stable Cardiovascular status: stable Postop Assessment: no headache, patient able to bend at knees, adequate PO intake, able to ambulate, no apparent nausea or vomiting and no backache Anesthetic complications: no   No notable events documented.  Last Vitals:  Vitals:   04/26/24 0017 04/26/24 0422  BP: (!) 147/79 (P) 128/61  Pulse: (!) 102 (P) 97  Resp: 18 (P) 18  Temp: 36.9 C (P) 36.9 C  SpO2: 99% (P) 99%    Last Pain:  Vitals:   04/26/24 0422  TempSrc: (P) Oral  PainSc:    Pain Goal:                   Koleson Reifsteck

## 2024-04-26 NOTE — Plan of Care (Signed)
   Problem: Education: Goal: Knowledge of General Education information will improve Description: Including pain rating scale, medication(s)/side effects and non-pharmacologic comfort measures Outcome: Completed/Met

## 2024-04-26 NOTE — Progress Notes (Signed)
 Rn called MD to let her know that the patient is allergic to procardia . Per provider patient will stay on labetalol  and start with lasix in the am.

## 2024-04-27 ENCOUNTER — Ambulatory Visit

## 2024-04-27 ENCOUNTER — Other Ambulatory Visit (HOSPITAL_COMMUNITY): Payer: Self-pay

## 2024-04-27 MED ORDER — FUROSEMIDE 20 MG PO TABS
40.0000 mg | ORAL_TABLET | Freq: Every day | ORAL | Status: DC
  Administered 2024-04-27: 40 mg via ORAL
  Filled 2024-04-27: qty 2

## 2024-04-27 MED ORDER — IBUPROFEN 600 MG PO TABS
600.0000 mg | ORAL_TABLET | Freq: Four times a day (QID) | ORAL | 0 refills | Status: DC
  Filled 2024-04-27: qty 40, 10d supply, fill #0

## 2024-04-27 MED ORDER — LABETALOL HCL 200 MG PO TABS
200.0000 mg | ORAL_TABLET | Freq: Two times a day (BID) | ORAL | 0 refills | Status: DC
  Filled 2024-04-27: qty 60, 30d supply, fill #0

## 2024-04-27 MED ORDER — SENNOSIDES-DOCUSATE SODIUM 8.6-50 MG PO TABS
2.0000 | ORAL_TABLET | ORAL | 0 refills | Status: DC
  Filled 2024-04-27: qty 60, 30d supply, fill #0

## 2024-04-27 MED ORDER — FERROUS SULFATE 325 (65 FE) MG PO TABS
325.0000 mg | ORAL_TABLET | ORAL | 3 refills | Status: DC
  Filled 2024-04-27: qty 90, 180d supply, fill #0

## 2024-04-27 MED ORDER — FUROSEMIDE 40 MG PO TABS
40.0000 mg | ORAL_TABLET | Freq: Every day | ORAL | 0 refills | Status: DC
  Filled 2024-04-27: qty 5, 5d supply, fill #0

## 2024-04-27 NOTE — Lactation Note (Signed)
 This note was copied from a baby's chart. Lactation Consultation Note  Patient Name: Barbara Baker [name] Date: 04/27/2024 Age:41 hours Reason for consult: Follow-up assessment;1st time breastfeeding;Late-preterm 34-36.6wks (h/o HSV and breast reduction (2023))  P1- MOB plans to formula feed only. LC provided more formula for infant since they were out of formula. MOB educated on engorgement/breast care. LC encouraged MOB to call for assistance as needed when discharged.  Maternal Data Has patient been taught Hand Expression?: No Does the patient have breastfeeding experience prior to this delivery?: No  Feeding Mother's Current Feeding Choice: Formula  Lactation Tools Discussed/Used Pump Education: Milk Storage  Interventions Interventions: Education;LC Services brochure  Discharge Discharge Education: Engorgement and breast care;Warning signs for feeding baby  Consult Status Consult Status: Complete Date: 04/27/24    Barbara Baker BS, IBCLC 04/27/2024, 6:05 PM

## 2024-04-27 NOTE — Patient Instructions (Signed)
 If interested in an outpatient lactation consult in office or virtually please reach out to us  at [facility] (First Floor) [address] [place] Please call [phone] and press 4 for lactation.    Octavio Ben Lactation Outpatient Support: [phone]  Melodi Sprung, [facility] for [facility]

## 2024-05-01 ENCOUNTER — Inpatient Hospital Stay (HOSPITAL_COMMUNITY): Admission: AD | Admit: 2024-05-01 | Payer: 59 | Source: Home / Self Care

## 2024-05-01 ENCOUNTER — Inpatient Hospital Stay (HOSPITAL_COMMUNITY)

## 2024-05-03 ENCOUNTER — Other Ambulatory Visit

## 2024-05-03 ENCOUNTER — Encounter: Admitting: Obstetrics and Gynecology

## 2024-05-04 ENCOUNTER — Encounter: Payer: Self-pay | Admitting: Obstetrics & Gynecology

## 2024-05-04 ENCOUNTER — Ambulatory Visit

## 2024-05-04 VITALS — BP 146/86 | HR 88

## 2024-05-04 DIAGNOSIS — O139 Gestational [pregnancy-induced] hypertension without significant proteinuria, unspecified trimester: Secondary | ICD-10-CM

## 2024-05-04 DIAGNOSIS — Z0131 Encounter for examination of blood pressure with abnormal findings: Secondary | ICD-10-CM

## 2024-05-04 MED ORDER — POTASSIUM CHLORIDE CRYS ER 20 MEQ PO TBCR: 20.0000 meq | EXTENDED_RELEASE_TABLET | Freq: Every day | ORAL | 0 refills | Status: DC

## 2024-05-04 MED ORDER — FUROSEMIDE 20 MG PO TABS: 20.0000 mg | ORAL_TABLET | Freq: Every day | ORAL | 0 refills | Status: DC

## 2024-05-04 NOTE — Progress Notes (Signed)
 Subjective:  Barbara Baker is a 43 y.o. female here for BP check.  Hypertension ROS: taking medications as instructed, no medication side effects noted, no TIA's, no chest pain on exertion, no dyspnea on exertion, and noting swelling of ankles.     Objective:  LMP 08/14/2023   Appearance alert, well appearing, and in no distress.    Assessment:   Blood Pressure today in office needs improvement.   Plan:  Follow up for Nurse visit in 1 week for BP check .   Okay per pickens to send in Lasix  x 20mg  po daily And Kdur 20 MEQ x 3 days

## 2024-05-10 ENCOUNTER — Other Ambulatory Visit

## 2024-05-10 ENCOUNTER — Encounter: Admitting: Family Medicine

## 2024-05-11 ENCOUNTER — Ambulatory Visit: Admitting: *Deleted

## 2024-05-11 VITALS — BP 134/83 | HR 92

## 2024-05-11 DIAGNOSIS — O165 Unspecified maternal hypertension, complicating the puerperium: Secondary | ICD-10-CM

## 2024-05-11 NOTE — Progress Notes (Signed)
 Subjective:  Barbara Baker is a 43 y.o. female here for BP check.   Hypertension ROS: Patient denies any headaches, visual symptoms, RUQ/epigastric pain or other concerning symptoms.  Objective:  BP 134/83   Pulse 92   LMP 08/14/2023   Breastfeeding Yes   Appearance alert, well appearing, and in no distress. General exam BP noted to be stable today in office.    Assessment:   Blood Pressure stable.   Plan:  Continue medication as directed until postpartum visit. Bettey Browning, RN

## 2024-05-17 ENCOUNTER — Encounter: Admitting: Obstetrics and Gynecology

## 2024-05-17 ENCOUNTER — Other Ambulatory Visit

## 2024-05-18 ENCOUNTER — Encounter: Payer: Self-pay | Admitting: Obstetrics and Gynecology

## 2024-05-18 ENCOUNTER — Ambulatory Visit: Admitting: Obstetrics and Gynecology

## 2024-05-18 VITALS — BP 131/85 | HR 88 | Wt 220.0 lb

## 2024-05-18 DIAGNOSIS — O165 Unspecified maternal hypertension, complicating the puerperium: Secondary | ICD-10-CM | POA: Diagnosis not present

## 2024-05-18 MED ORDER — LABETALOL HCL 200 MG PO TABS: 200.0000 mg | ORAL_TABLET | Freq: Three times a day (TID) | ORAL | 0 refills | Status: AC

## 2024-05-18 NOTE — Progress Notes (Signed)
 Patient here for B/P check and c/o swelling.   Reports B/P was 148/105 P:90 at home.

## 2024-05-19 ENCOUNTER — Ambulatory Visit: Payer: Self-pay | Admitting: Obstetrics and Gynecology

## 2024-05-19 LAB — COMPREHENSIVE METABOLIC PANEL WITH GFR
ALT: 37 IU/L — ABNORMAL HIGH (ref 0–32)
AST: 28 IU/L (ref 0–40)
Albumin: 4.4 g/dL (ref 3.9–4.9)
Alkaline Phosphatase: 101 IU/L (ref 44–121)
BUN/Creatinine Ratio: 11 (ref 9–23)
BUN: 9 mg/dL (ref 6–24)
Bilirubin Total: 0.3 mg/dL (ref 0.0–1.2)
CO2: 20 mmol/L (ref 20–29)
Calcium: 10.1 mg/dL (ref 8.7–10.2)
Chloride: 106 mmol/L (ref 96–106)
Creatinine, Ser: 0.81 mg/dL (ref 0.57–1.00)
Globulin, Total: 2.9 g/dL (ref 1.5–4.5)
Glucose: 88 mg/dL (ref 70–99)
Potassium: 4.6 mmol/L (ref 3.5–5.2)
Sodium: 142 mmol/L (ref 134–144)
Total Protein: 7.3 g/dL (ref 6.0–8.5)
eGFR: 93 mL/min/{1.73_m2} (ref >59)

## 2024-05-19 LAB — CBC
Hematocrit: 41.8 % (ref 34.0–46.6)
Hemoglobin: 13.7 g/dL (ref 11.1–15.9)
MCH: 29.1 pg (ref 26.6–33.0)
MCHC: 32.8 g/dL (ref 31.5–35.7)
MCV: 89 fL (ref 79–97)
Platelets: 395 10*3/uL (ref 150–450)
RBC: 4.7 x10E6/uL (ref 3.77–5.28)
RDW: 13.9 % (ref 11.7–15.4)
WBC: 7.3 10*3/uL (ref 3.4–10.8)

## 2024-05-19 NOTE — Progress Notes (Signed)
 Obstetrics and Gynecology Visit Return Patient Evaluation  Appointment Date: 05/19/2024  Primary Care Provider: Eartha Gold  OBGYN Clinic: Center for [facility]  Chief Complaint: PP HTN follow up  History of Present Illness:  Barbara Baker is a 43 y.o. G3P2 s/p 5/26 36wk SVD for PPROM. Pregnancy c/b GHTN, placental accessory lobe.  Patient discharged on 5/28 with lasix  course and labetalol  200 bid; pt got another 3d course of lasix  on 6/4 to help with swelling  Patient had some increased BPs at home yesterday but had not taken her meds so advised to take meds and visit made for today  Interval History: Since that time, she states that she has no s/s of pre-eclampsia and she took her labetalol  200 about 3 hours prior.  Review of Systems: as noted in the History of Present Illness.  Patient Active Problem List   Diagnosis Date Noted   Preterm premature rupture of membranes (PPROM) with unknown onset of labor 04/25/2024   Vaginal delivery 04/25/2024   Abnormal fetal ultrasound 04/12/2024   History of herpes simplex infection 04/12/2024   Gestational hypertension without significant proteinuria 04/06/2024   Marginal insertion of umbilical cord affecting management of mother 12/23/2023   Psoriasis 12/09/2023   GBS (group B streptococcus) in urine complicating pregnancy 11/09/2023   Supervision of high risk pregnancy, antepartum 09/30/2023   Maternal age 74+, multigravida, antepartum 09/30/2023   Allergic rhinitis 04/03/2022   Episodic cluster headache, not intractable 03/09/2020   Prediabetes 06/02/2018   Medications:  Misk J. Whiteley had no medications administered during this visit. Current Outpatient Medications  Medication Sig Dispense Refill   ibuprofen  (ADVIL ) 600 MG tablet Take 1 tablet (600 mg total) by mouth every 6 (six) hours. 40 tablet 0   Prenatal Vit-Fe Fumarate-FA (PRENATAL PO) Take by mouth.     ferrous sulfate  325 (65 FE) MG  tablet Take 1 tablet (325 mg total) by mouth every other day. (Patient not taking: Reported on 05/18/2024) 90 tablet 3   fluticasone  (FLONASE ) 50 MCG/ACT nasal spray Place 1 spray into both nostrils daily. (Patient not taking: Reported on 05/18/2024) 9.9 mL 2   labetalol  (NORMODYNE ) 200 MG tablet Take 1 tablet (200 mg total) by mouth bid 90 tablet 0   loratadine (CLARITIN) 10 MG tablet Take 10 mg by mouth daily. (Patient not taking: Reported on 05/18/2024)     meclizine (ANTIVERT) 25 MG tablet Take 25 mg by mouth 3 (three) times daily as needed. (Patient not taking: Reported on 05/18/2024)     senna-docusate (SENOKOT-S) 8.6-50 MG tablet Take 2 tablets by mouth daily. (Patient not taking: Reported on 05/18/2024) 60 tablet 0   valACYclovir  (VALTREX ) 1000 MG tablet Take 1 tablet (1,000 mg total) by mouth daily. (Patient not taking: Reported on 05/18/2024) 30 tablet 0   No current facility-administered medications for this visit.    Allergies: is allergic to procardia  xl [nifedipine  er] and sulfa antibiotics.  Physical Exam:  BP 131/85   Pulse 88   Wt 220 lb (99.8 kg)   LMP 08/14/2023   BMI 35.51 kg/m  Body mass index is 35.51 kg/m. General appearance: Well nourished, well developed female in no acute distress.  Ext: trace edema b/l Neuro/Psych:  Normal mood and affect.    Assessment: patient stable  Plan:  1. Postpartum hypertension (Primary) Recommend going up to labetalol  200 q8h. Will check pre-e labs today - CBC - Comprehensive metabolic panel with GFR  Return in about 1 week (around 05/25/2024)  for in person, bp check, rn visit.  Future Appointments  Date Time Provider Department Center  05/25/2024  9:45 AM [facility] NURSE [facility]  06/08/2024  1:50 PM Anyanwu, Kathrine Paris, MD [facility]  06/14/2024  8:30 AM Elta Halter, MD [facility] None    Tyler Gallant MD Attending Center for [facility])

## 2024-05-25 ENCOUNTER — Ambulatory Visit (INDEPENDENT_AMBULATORY_CARE_PROVIDER_SITE_OTHER)

## 2024-05-25 VITALS — BP 134/88 | HR 93 | Wt 219.0 lb

## 2024-05-25 DIAGNOSIS — Z0130 Encounter for examination of blood pressure without abnormal findings: Secondary | ICD-10-CM

## 2024-05-25 DIAGNOSIS — O165 Unspecified maternal hypertension, complicating the puerperium: Secondary | ICD-10-CM

## 2024-05-25 NOTE — Progress Notes (Signed)
 Subjective:  Barbara Baker is a [id number] here for BP check.  She is 4 weeks postpartum following a normal spontaneous vaginal delivery.    Hypertension ROS: Patient denies headache, visual changes, and abdominal pain.   Objective:  LMP 08/14/2023   Appearance alert, well appearing, and in no distress.  Assessment:   Blood Pressure improved.   Plan:  Continue Medication as advised and keep Post Partum appt.Barbara Baker, CMA

## 2024-05-27 ENCOUNTER — Other Ambulatory Visit

## 2024-05-30 ENCOUNTER — Other Ambulatory Visit

## 2024-05-31 ENCOUNTER — Other Ambulatory Visit

## 2024-05-31 DIAGNOSIS — Z0000 Encounter for general adult medical examination without abnormal findings: Secondary | ICD-10-CM

## 2024-06-01 ENCOUNTER — Ambulatory Visit: Payer: Self-pay | Admitting: Obstetrics & Gynecology

## 2024-06-01 LAB — LIPID PANEL
Chol/HDL Ratio: 5.7 ratio — ABNORMAL HIGH (ref 0.0–4.4)
Cholesterol, Total: 297 mg/dL — ABNORMAL HIGH (ref 100–199)
HDL: 52 mg/dL (ref >39)
LDL Chol Calc (NIH): 198 mg/dL — ABNORMAL HIGH (ref 0–99)
Triglycerides: 243 mg/dL — ABNORMAL HIGH (ref 0–149)
VLDL Cholesterol Cal: 47 mg/dL — ABNORMAL HIGH (ref 5–40)

## 2024-06-08 ENCOUNTER — Encounter: Payer: Self-pay | Admitting: Obstetrics & Gynecology

## 2024-06-08 ENCOUNTER — Ambulatory Visit (INDEPENDENT_AMBULATORY_CARE_PROVIDER_SITE_OTHER): Admitting: Obstetrics & Gynecology

## 2024-06-08 DIAGNOSIS — Z30430 Encounter for insertion of intrauterine contraceptive device: Secondary | ICD-10-CM

## 2024-06-08 DIAGNOSIS — Z3202 Encounter for pregnancy test, result negative: Secondary | ICD-10-CM | POA: Diagnosis not present

## 2024-06-08 DIAGNOSIS — Z1231 Encounter for screening mammogram for malignant neoplasm of breast: Secondary | ICD-10-CM | POA: Diagnosis not present

## 2024-06-08 DIAGNOSIS — Z8759 Personal history of other complications of pregnancy, childbirth and the puerperium: Secondary | ICD-10-CM | POA: Diagnosis not present

## 2024-06-08 LAB — POCT URINE PREGNANCY: Preg Test, Ur: NEGATIVE

## 2024-06-08 MED ORDER — LEVONORGESTREL 20 MCG/DAY IU IUD
1.0000 | INTRAUTERINE_SYSTEM | Freq: Once | INTRAUTERINE | Status: AC
  Administered 2024-06-08: 1 via INTRAUTERINE

## 2024-06-08 NOTE — Progress Notes (Signed)
 Post Partum Visit Note  CHLORIS MARCOUX is a 43 y.o. [id number] female who presents for a postpartum visit. She is 6 weeks postpartum following a normal spontaneous vaginal delivery.  I have fully reviewed the prenatal and intrapartum course. The delivery was at 36 gestational weeks.  Anesthesia: epidural. Postpartum course has been uncomplicated. Baby is doing well. Baby is feeding by both breast and bottle - Similac Neosure. Bleeding no bleeding. Bowel function is normal. Bladder function is normal. Patient is not sexually active.  Postpartum depression screening: negative.  The pregnancy intention screening data noted above was reviewed. Potential methods of contraception were discussed. The patient elected to proceed with Mirena  IUD.   Edinburgh Postnatal Depression Scale - 06/08/24 1352       Edinburgh Postnatal Depression Scale:  In the Past 7 Days   I have been able to laugh and see the funny side of things. 0    I have looked forward with enjoyment to things. 0    I have blamed myself unnecessarily when things went wrong. 1    I have been anxious or worried for no good reason. 1    I have felt scared or panicky for no good reason. 0    Things have been getting on top of me. 1    I have been so unhappy that I have had difficulty sleeping. 0    I have felt sad or miserable. 0    I have been so unhappy that I have been crying. 0    The thought of harming myself has occurred to me. 0    Edinburgh Postnatal Depression Scale Total 3          Health Maintenance Due  Topic Date Due   Hepatitis B Vaccines (1 of 3 - 19+ 3-dose series) Never done   HPV VACCINES (1 - Risk 3-dose SCDM series) Never done   COVID-19 Vaccine (3 - Pfizer risk series) 08/13/2020    The following portions of the patient's history were reviewed and updated as appropriate: allergies, current medications, past family history, past medical history, past social history, past surgical history, and problem  list.  Review of Systems Pertinent items noted in HPI and remainder of comprehensive ROS otherwise negative.  Objective:  LMP 08/14/2023    General:  alert and no distress   Breasts:  deferred  Lungs: clear to auscultation bilaterally  Heart:  regular rate and rhythm  Abdomen: soft, non-tender; bowel sounds normal; no masses,  no organomegaly   GU exam:  NEFG, normal vagina, multiparous cervix, pap smear obtained. Done in presence of chaperone       IUD Insertion Procedure Note Patient identified, informed consent performed, consent signed.   Discussed risks of irregular bleeding, cramping, infection, malpositioning or misplacement of the IUD outside the uterus which may require further procedure such as laparoscopy. Also discussed >99% contraception efficacy, increased risk of ectopic pregnancy with failure of method.   Emphasized that this did not protect against STIs, condoms recommended during all sexual encounters. Time out was performed.  Chaperone present.  Urine pregnancy test negative.  Speculum placed in the vagina.  Cervix visualized.  Cleaned with Betadine x 2.  Grasped anteriorly with a single tooth tenaculum.  Uterus sounded to 8.5 cm.  Mirena   IUD placed per manufacturer's recommendations.  Strings trimmed to 3 cm. Tenaculum was removed, good hemostasis noted.  Patient tolerated procedure well.   Patient was given post-procedure instructions.  Patient was also  asked to check IUD strings periodically and follow up in 4 weeks for IUD check.   Assessment: (Diagnoses and Orders)    1. Encounter for IUD insertion - POCT urine pregnancy - levonorgestrel  (MIRENA ) 20 MCG/DAY IUD 1 each  2. History of gestational hypertension  3. Preterm delivery, delivered  4. Breast cancer screening by mammogram - MM 3D SCREENING MAMMOGRAM BILATERAL BREAST; Future  5. Postpartum care following vaginal delivery (Primary) - IGP, Aptima HPV, rfx 16/18,45  Plan:   Essential components of  care per ACOG recommendations:  1.  Mood and well being: Patient with negative depression screening today. Reviewed local resources for support.  - Patient tobacco use? No.   - hx of drug use? No.    2. Infant care and feeding:  -Patient currently breastmilk feeding? Yes. Reviewed importance of draining breast regularly to support lactation.  -Social determinants of h=ealth (SDOH) reviewed in EPIC. No concerns.  3. Sexuality, contraception and birth spacing - Patient does not want a pregnancy in the next year.  Desired family size is 2 children.  - Reviewed reproductive life planning. Reviewed contraceptive methods based on pt preferences and effectiveness.  Patient desired IUD or IUS today, Mirena  placed.  - Discussed birth spacing of 18 months  4. Sleep and fatigue -Encouraged family/partner/community support of 4 hrs of uninterrupted sleep to help with mood and fatigue  5. Physical Recovery  - Discussed patients delivery and complications. She describes her labor as good. - Patient had a Vaginal, no problems at delivery. Patient had a no laceration. Perineal healing reviewed. Patient expressed understanding - Patient has urinary incontinence? No. - Patient is safe to resume physical and sexual activity  6.  Health Maintenance - HM due items addressed Yes - Pap smear done at today's visit.  -Breast Cancer screening indicated? Yes. Patient referred today for mammogram.   7. Chronic Disease/Pregnancy Condition follow up: GHTN - BP stable today, will wean off Labetalol  as instructed - PCP follow up    Gloris Hugger, MD Center for [facility], [facility]

## 2024-06-10 LAB — IGP, APTIMA HPV, RFX 16/18,45
HPV Aptima: NEGATIVE
PAP Smear Comment: 0

## 2024-06-11 ENCOUNTER — Ambulatory Visit: Payer: Self-pay | Admitting: Obstetrics & Gynecology

## 2024-06-11 ENCOUNTER — Other Ambulatory Visit: Payer: Self-pay | Admitting: Obstetrics and Gynecology

## 2024-06-11 DIAGNOSIS — N6489 Other specified disorders of breast: Secondary | ICD-10-CM

## 2024-06-14 ENCOUNTER — Encounter: Payer: Self-pay | Admitting: Dermatology

## 2024-06-14 ENCOUNTER — Ambulatory Visit (INDEPENDENT_AMBULATORY_CARE_PROVIDER_SITE_OTHER): Admitting: Dermatology

## 2024-06-14 ENCOUNTER — Encounter: Payer: Self-pay | Admitting: *Deleted

## 2024-06-14 DIAGNOSIS — Z79899 Other long term (current) drug therapy: Secondary | ICD-10-CM

## 2024-06-14 DIAGNOSIS — L219 Seborrheic dermatitis, unspecified: Secondary | ICD-10-CM

## 2024-06-14 DIAGNOSIS — L910 Hypertrophic scar: Secondary | ICD-10-CM | POA: Diagnosis not present

## 2024-06-14 DIAGNOSIS — L409 Psoriasis, unspecified: Secondary | ICD-10-CM

## 2024-06-14 DIAGNOSIS — Z7189 Other specified counseling: Secondary | ICD-10-CM | POA: Diagnosis not present

## 2024-06-14 DIAGNOSIS — L408 Other psoriasis: Secondary | ICD-10-CM | POA: Diagnosis not present

## 2024-06-14 MED ORDER — HYDROCORTISONE 2.5 % EX CREA: TOPICAL_CREAM | Freq: Two times a day (BID) | CUTANEOUS | 11 refills | Status: AC | PRN

## 2024-06-14 MED ORDER — CLOBETASOL PROPIONATE 0.05 % EX FOAM: CUTANEOUS | 6 refills | Status: AC

## 2024-06-14 MED ORDER — CICLOPIROX 1 % EX SHAM: 1.0000 | MEDICATED_SHAMPOO | CUTANEOUS | 11 refills | Status: AC

## 2024-06-14 MED ORDER — FLUOCINOLONE ACETONIDE BODY 0.01 % EX OIL: 1.0000 | TOPICAL_OIL | Freq: Every day | CUTANEOUS | 3 refills | Status: DC

## 2024-06-14 MED ORDER — KETOCONAZOLE 2 % EX CREA: 1.0000 | TOPICAL_CREAM | CUTANEOUS | 11 refills | Status: AC

## 2024-06-14 NOTE — Progress Notes (Signed)
 Follow-Up Visit   Subjective  Barbara Baker is a 43 y.o. female who presents for the following: keloid follow up, hx of keloid at umbilical area, injected with kenalog 10 injection in past. Still some thickened areas she would like treated. Also reports some thickened scars under breast she would like treated as well. H/o breast reduction 2 years ago leaving scars.   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 Currently using clobetasol  foam, ciclopirox  shampoo, flucinolone  oil for scalp and hydrocortisone  2.5 % cream for face. Would like refills.  The following portions of the chart were reviewed this encounter and updated as appropriate: medications, allergies, medical history  Review of Systems:  No other skin or systemic complaints except as noted in HPI or Assessment and Plan.  Objective  Well appearing patient in no apparent distress; mood and affect are within normal limits.   A focused examination was performed of the following areas: Scalp, face, inframammary area b/l , abdomen  Relevant exam findings are noted in the Assessment and Plan.    Assessment & Plan   Psoriasis - Sebo Psorasis  face,scalp Exam: Pink patches with greasy scale at face, well-demarcated erythematous papules/plaques with silvery scale, guttate pink scaly papules at scalp Chronic and persistent condition with duration or expected duration over one year. Condition is symptomatic/ bothersome to patient. Not currently at goal.  Counseling on psoriasis and coordination of care  psoriasis is a chronic non-curable, but treatable genetic/hereditary disease that may have other systemic features affecting other organ systems such as joints (Psoriatic Arthritis). It is associated with an increased risk of inflammatory bowel disease, heart disease, non-alcoholic fatty liver disease, and depression.  Treatments include light and laser treatments; topical medications; and systemic medications including oral  and injectables.   Plan:  Restart ketoconazole  2% cream - apply to face once a day Tue, Thursday, and Saturday Continue  Hydrocortisone  2.5% cream apply to face once a day Mon, Wednesday and Friday  Continue Clobetasol  Foam qd up to 5d/wk aa scalp prn persistent or severe spots, avoid f/g/a Patient using Every 2 - 3 weeks when itchy we would recommend she continue that regimine  Continue Fluocinolone  oil qd up to 5d/wk aa scalp Patient is using every other day 3 - 4 days weekly , we would recommend she continues that regimine  Continue Ciclopirox  shampoo trying to increase to weekly Patient is using once or twice a month. Would recommend increasing  PSORIASIS   Related Medications Fluocinolone  Acetonide Body 0.01 % OIL Apply 1 application  topically daily. Qd to aa scalp prn flares Ciclopirox  1 % shampoo Apply 1 application  topically once a week. Apply to scalp let sit 5 minutes and rinse out hydrocortisone  2.5 % cream Apply topically 2 (two) times daily as needed (Rash). apply to affected areas on her face once a day Mon, Wednesday and Friday clobetasol  (OLUX ) 0.05 % topical foam Apply topically as directed. Qd aa psoriasis on scalp up to 5 day per week, avoid face, groin, axilla SEBORRHEIC DERMATITIS   Related Medications ketoconazole  (NIZORAL ) 2 % cream Apply 1 Application topically as directed. Apply to scaly areas on face at bedtime on Tuesday , Thursday and Saturdays KELOID umbilicus and inframammary area Chronic and persistent condition with duration or expected duration over one year. Condition is symptomatic / bothersome to patient. Not to goal.  History of scar at umbilicus treated with ILK injections in past History of scars at inframammary from prior breast reduction surgery -  not treated in past.   Discussed will bring back in a few months to inject both areas with Kenalog 40 (1 part) and 5 fluorouracil (3 parts)   Return for 2 month keloid injection , 1 year  sebopsoriasis follow up.  IEleanor Blush, CMA, am acting as scribe for Alm Rhyme, MD.   Documentation: I have reviewed the above documentation for accuracy and completeness, and I agree with the above.  Alm Rhyme, MD

## 2024-06-14 NOTE — Patient Instructions (Signed)

## 2024-06-17 ENCOUNTER — Other Ambulatory Visit (HOSPITAL_COMMUNITY): Payer: Self-pay

## 2024-06-24 ENCOUNTER — Ambulatory Visit
Admission: RE | Admit: 2024-06-24 | Discharge: 2024-06-24 | Disposition: A | Discharge status: Home / Self Care | Source: Ambulatory Visit | Attending: Obstetrics & Gynecology | Admitting: Obstetrics & Gynecology

## 2024-06-24 DIAGNOSIS — Z1231 Encounter for screening mammogram for malignant neoplasm of breast: Secondary | ICD-10-CM | POA: Diagnosis present

## 2024-06-30 ENCOUNTER — Inpatient Hospital Stay
Admission: RE | Admit: 2024-06-30 | Discharge: 2024-06-30 | Discharge status: Home / Self Care | Source: Ambulatory Visit | Attending: Obstetrics & Gynecology | Admitting: Obstetrics & Gynecology

## 2024-06-30 ENCOUNTER — Ambulatory Visit
Admission: RE | Admit: 2024-06-30 | Discharge: 2024-06-30 | Disposition: A | Discharge status: Home / Self Care | Source: Ambulatory Visit | Attending: Obstetrics & Gynecology | Admitting: Obstetrics & Gynecology

## 2024-06-30 ENCOUNTER — Inpatient Hospital Stay
Admission: RE | Admit: 2024-06-30 | Discharge: 2024-06-30 | Disposition: A | Discharge status: Home / Self Care | Source: Ambulatory Visit | Attending: Obstetrics & Gynecology | Admitting: Obstetrics & Gynecology

## 2024-06-30 DIAGNOSIS — N6489 Other specified disorders of breast: Secondary | ICD-10-CM

## 2024-07-01 ENCOUNTER — Other Ambulatory Visit (HOSPITAL_COMMUNITY)
Admission: RE | Admit: 2024-07-01 | Discharge: 2024-07-01 | Disposition: A | Discharge status: Home / Self Care | Source: Ambulatory Visit | Attending: Family Medicine | Admitting: Family Medicine

## 2024-07-01 ENCOUNTER — Encounter: Payer: Self-pay | Admitting: Family Medicine

## 2024-07-01 ENCOUNTER — Ambulatory Visit: Admitting: Family Medicine

## 2024-07-01 VITALS — BP 122/82 | HR 91 | Wt 221.0 lb

## 2024-07-01 DIAGNOSIS — F418 Other specified anxiety disorders: Secondary | ICD-10-CM | POA: Diagnosis not present

## 2024-07-01 DIAGNOSIS — N898 Other specified noninflammatory disorders of vagina: Secondary | ICD-10-CM

## 2024-07-01 DIAGNOSIS — Z30432 Encounter for removal of intrauterine contraceptive device: Secondary | ICD-10-CM

## 2024-07-01 DIAGNOSIS — O99345 Other mental disorders complicating the puerperium: Secondary | ICD-10-CM | POA: Diagnosis not present

## 2024-07-01 DIAGNOSIS — Z30011 Encounter for initial prescription of contraceptive pills: Secondary | ICD-10-CM

## 2024-07-01 MED ORDER — SERTRALINE HCL 25 MG PO TABS: 25.0000 mg | ORAL_TABLET | Freq: Every day | ORAL | 1 refills | Status: AC

## 2024-07-01 MED ORDER — NORGESTIM-ETH ESTRAD TRIPHASIC 0.18/0.215/0.25 MG-25 MCG PO TABS: 1.0000 | ORAL_TABLET | Freq: Every day | ORAL | 11 refills | Status: AC

## 2024-07-01 MED ORDER — HYDROXYZINE HCL 25 MG PO TABS: 25.0000 mg | ORAL_TABLET | Freq: Four times a day (QID) | ORAL | 2 refills | Status: AC | PRN

## 2024-07-01 NOTE — Progress Notes (Signed)
 CC: Removal of IUD, Pinching and hormones are too much  Also wanting to discuss Mental health- possible medication and or referral of BH   Wanting to go back to [facility]

## 2024-07-01 NOTE — Progress Notes (Signed)
 Mood check ENCOUNTER NOTE  Subjective:   Barbara Baker is a 43 y.o. [phone] female here for a mood check   Mood: Patient reports they are feeling a worsening mood. She reports I answered all those questions that things are OK because she did not want us  to think she is crazy and take her baby.  She is currently experiencing mood swings and increased anxiety. She finally asked her mother for help with the baby and at the same time she worries about the baby when they are not with her. She reports she has trouble going to sleep due to worries.   She has had a lot of life transitions as well-- FOB is not involved and she is basically solo parenting . She has a 39 yo child but tries to not have him help because she knows it isn't his baby. She is also transitioning back to work.  Patient currently breastfeeding? No Have they been on prior SSRI/SNRI? No  IUD: does not like the device feels a pricking sensation throughout the day. She having spotting constantly. Noted vaginal discharge with odor as well.       03/01/2024    8:14 AM 11/09/2023    3:12 PM  GAD 7 : Generalized Anxiety Score  Nervous, Anxious, on Edge 0 0  Control/stop worrying 0 0  Worry too much - different things 0 0  Trouble relaxing 0 0  Restless 0 0  Easily annoyed or irritable 0 0  Afraid - awful might happen 0 0  Total GAD 7 Score 0 0  Anxiety Difficulty Not difficult at all Not difficult at all   Flowsheet Row Routine Prenatal from 03/01/2024 in [facility]  PHQ-9 Total Score 0        Gynecologic History No LMP recorded. (Menstrual status: IUD). Contraception: OCP (estrogen/progesterone)  Health Maintenance Due  Topic Date Due   Hepatitis B Vaccines (1 of 3 - 19+ 3-dose series) Never done   HPV VACCINES (1 - Risk 3-dose SCDM series) Never done   COVID-19 Vaccine (3 - Pfizer risk series) 08/13/2020   INFLUENZA VACCINE  07/01/2024     The following  portions of the patient's history were reviewed and updated as appropriate: allergies, current medications, past family history, past medical history, past social history, past surgical history and problem list.  Review of Systems Pertinent items are noted in HPI.   Objective:  BP 122/82   Pulse 91   Wt 221 lb (100.2 kg)   BMI 35.67 kg/m  Gen: well appearing, NAD HEENT: no scleral icterus CV: RR Lung: Normal WOB Ext: warm well perfused  Pysch: Stable. Linear though process- somewhat tangential.  IUD Removal  Patient identified, informed consent performed, consent signed.  Patient was in the dorsal lithotomy position, normal external genitalia was noted.  A speculum was placed in the patient's vagina, normal discharge was noted, no lesions. The cervix was visualized, no lesions, no abnormal discharge.  The strings of the IUD were not visualized-- used kelly forceps to grasp in the endometrial canal and the strings gently pilled and  IUD was removed in its entirety.  Patient tolerated the procedure well.      Assessment and Plan:  1. Postpartum anxiety (Primary) Reviewed PPA/PPD-- patient agrees with diagnosis and self reported issues with racing thoughts and anxiety. Reviewed SSRI treatment with counseling.  - sertraline (ZOLOFT) 25 MG tablet; Take 1 tablet (25 mg total) by mouth  daily. In 1 week if not having severe nausea or worsening anxiety symptoms, increase to 2 pills (50mg ) until seen  Dispense: 45 tablet; Refill: 1 - hydrOXYzine (ATARAX) 25 MG tablet; Take 1 tablet (25 mg total) by mouth every 6 (six) hours as needed for itching.  Dispense: 30 tablet; Refill: 2 -Referral to IBH  2. Encounter for IUD removal Patient will use OCP for contraception.  Routine preventative health maintenance measures emphasized. - Norgestim-Eth Estrad Triphasic (NORGESTIMATE-ETHINYL ESTRADIOL TRIPHASIC) 0.18/0.215/0.25 MG-25 MCG tab; Take 1 tablet by mouth daily.  Dispense: 28 tablet; Refill:  11  3. Encounter for initial prescription of contraceptive pills - Norgestim-Eth Estrad Triphasic (NORGESTIMATE-ETHINYL ESTRADIOL TRIPHASIC) 0.18/0.215/0.25 MG-25 MCG tab; Take 1 tablet by mouth daily.  Dispense: 28 tablet; Refill: 11  4. Vaginal discharge - Cervicovaginal ancillary only( [place])   Please refer to After Visit Summary for other counseling recommendations.   No follow-ups on file.  Suzen Maryan Masters, MD, MPH, ABFM Attending Physician Faculty Practice- Center for [facility]

## 2024-07-04 ENCOUNTER — Encounter: Payer: Self-pay | Admitting: Family Medicine

## 2024-07-04 LAB — CERVICOVAGINAL ANCILLARY ONLY
Bacterial Vaginitis (gardnerella): POSITIVE — AB
Candida Glabrata: NEGATIVE
Candida Vaginitis: NEGATIVE
Comment: NEGATIVE
Comment: NEGATIVE
Comment: NEGATIVE

## 2024-07-05 ENCOUNTER — Other Ambulatory Visit: Payer: Self-pay | Admitting: *Deleted

## 2024-07-05 ENCOUNTER — Ambulatory Visit: Admitting: Obstetrics & Gynecology

## 2024-07-05 MED ORDER — METRONIDAZOLE 0.75 % VA GEL: 1.0000 | Freq: Every day | VAGINAL | 1 refills | Status: AC

## 2024-07-06 ENCOUNTER — Ambulatory Visit: Payer: Self-pay | Admitting: Family Medicine

## 2024-07-06 DIAGNOSIS — B9689 Other specified bacterial agents as the cause of diseases classified elsewhere: Secondary | ICD-10-CM

## 2024-07-06 MED ORDER — METRONIDAZOLE 500 MG PO TABS: 500.0000 mg | ORAL_TABLET | Freq: Two times a day (BID) | ORAL | 0 refills | Status: AC

## 2024-07-12 ENCOUNTER — Other Ambulatory Visit: Payer: Self-pay

## 2024-07-12 DIAGNOSIS — L409 Psoriasis, unspecified: Secondary | ICD-10-CM

## 2024-07-12 MED ORDER — FLUOCINOLONE ACETONIDE BODY 0.01 % EX OIL: 1.0000 | TOPICAL_OIL | Freq: Every day | CUTANEOUS | 0 refills | Status: DC

## 2024-07-12 NOTE — Progress Notes (Signed)
 90 day supply request sent in from pharmacy. aw

## 2024-07-13 ENCOUNTER — Other Ambulatory Visit: Payer: Self-pay | Admitting: *Deleted

## 2024-07-13 MED ORDER — FLUCONAZOLE 150 MG PO TABS: 150.0000 mg | ORAL_TABLET | Freq: Once | ORAL | 3 refills | Status: AC

## 2024-07-24 ENCOUNTER — Other Ambulatory Visit: Payer: Self-pay | Admitting: Family Medicine

## 2024-07-24 DIAGNOSIS — O99345 Other mental disorders complicating the puerperium: Secondary | ICD-10-CM

## 2024-08-05 ENCOUNTER — Ambulatory Visit: Admitting: Family Medicine

## 2024-08-15 ENCOUNTER — Other Ambulatory Visit: Payer: Self-pay | Admitting: Otolaryngology

## 2024-08-15 DIAGNOSIS — R42 Dizziness and giddiness: Secondary | ICD-10-CM

## 2024-08-30 ENCOUNTER — Encounter: Payer: Self-pay | Admitting: Dermatology

## 2024-08-30 ENCOUNTER — Ambulatory Visit: Admitting: Dermatology

## 2024-08-30 DIAGNOSIS — Z5111 Encounter for antineoplastic chemotherapy: Secondary | ICD-10-CM | POA: Diagnosis not present

## 2024-08-30 DIAGNOSIS — L910 Hypertrophic scar: Secondary | ICD-10-CM | POA: Diagnosis not present

## 2024-08-30 DIAGNOSIS — Z79899 Other long term (current) drug therapy: Secondary | ICD-10-CM | POA: Diagnosis not present

## 2024-08-30 DIAGNOSIS — Z7189 Other specified counseling: Secondary | ICD-10-CM | POA: Diagnosis not present

## 2024-08-30 MED ORDER — FLUOROURACIL CHEMO INJECTION 500 MG/10ML FOR DERMATOLOGY USE
50.0000 mg | Freq: Once | INTRAVENOUS | Status: AC
  Administered 2024-08-30: 50 mg via INTRALESIONAL

## 2024-08-30 MED ORDER — TRIAMCINOLONE ACETONIDE 40 MG/ML IJ SUSP
40.0000 mg | Freq: Once | INTRAMUSCULAR | Status: AC
  Administered 2024-08-30: 40 mg

## 2024-08-30 NOTE — Patient Instructions (Signed)

## 2024-08-30 NOTE — Progress Notes (Unsigned)
   Follow-Up Visit   Subjective  Barbara Baker is a 43 y.o. female who presents for the following: Keloid 75m f/u bil inframammary areas, bil flanks Pt is NOT pregnant or breast feeding.  The following portions of the chart were reviewed this encounter and updated as appropriate: medications, allergies, medical history  Review of Systems:  No other skin or systemic complaints except as noted in HPI or Assessment and Plan.  Objective  Well appearing patient in no apparent distress; mood and affect are within normal limits.  A focused examination was performed of the following areas: trunk  Relevant exam findings are noted in the Assessment and Plan.  abdomen, R side, L side Firm brown dermal papule(s)/plaque(s).    Assessment & Plan   KELOID abdomen, R side, L side IL injections of Kenalog 40mg  mixed with 5FU 1:3  Intralesional steroid injection side effects were reviewed including thinning of the skin and discoloration, such as redness, lightening or darkening.  Discussed with patient she can not breast feed with the 5FU IL injections.  Abdomen 2.0cc injected total of 9 injections R side 1.5cc injected to 2 lesions total of 7 injections  L inframammar 3.0cc total of 11 injections Intralesional injection - abdomen, R side, L side Location: bil inframammary, bil flanks, abdomen  Informed Consent: Discussed risks (infection, pain, bleeding, bruising, thinning of the skin, loss of skin pigment, lack of resolution, and recurrence of lesion) and benefits of the procedure, as well as the alternatives. Informed consent was obtained. Preparation: The area was prepared a standard fashion.  Procedure Details: An intralesional injection was performed with Kenalog 40 mg/cc, 5-fluorouracil 1:3. 6.5 cc in total were injected.  Total number of injections: 27  Plan: The patient was instructed on post-op care. Recommend OTC analgesia as needed for pain.  Kenalog 40 NDC  [id number], exp 06/30/2025 Fluorouracil 2.5g per 50ml [id number] exp 07/07/2025  Related Medications triamcinolone  acetonide (KENALOG-40) injection 40 mg  fluorouracil (ADRUCIL) chemo injection 50 mg   Return for 2-21m keloid injections.  I, Grayce Saunas, RMA, am acting as scribe for Alm Rhyme, MD .   Documentation: I have reviewed the above documentation for accuracy and completeness, and I agree with the above.  Alm Rhyme, MD

## 2024-08-31 ENCOUNTER — Encounter: Payer: Self-pay | Admitting: Dermatology

## 2024-09-01 ENCOUNTER — Other Ambulatory Visit: Payer: Self-pay

## 2024-09-01 DIAGNOSIS — L409 Psoriasis, unspecified: Secondary | ICD-10-CM

## 2024-09-01 MED ORDER — FLUOCINOLONE ACETONIDE BODY 0.01 % EX OIL: 1.0000 | TOPICAL_OIL | Freq: Every day | CUTANEOUS | 1 refills | Status: AC

## 2024-09-05 ENCOUNTER — Encounter: Payer: Self-pay | Admitting: Otolaryngology

## 2024-09-12 ENCOUNTER — Inpatient Hospital Stay
Admission: RE | Admit: 2024-09-12 | Discharge: 2024-09-12 | Disposition: A | Source: Ambulatory Visit | Attending: Otolaryngology | Admitting: Otolaryngology

## 2024-09-12 DIAGNOSIS — R42 Dizziness and giddiness: Secondary | ICD-10-CM

## 2024-10-07 ENCOUNTER — Ambulatory Visit: Admitting: Obstetrics and Gynecology

## 2024-10-07 VITALS — BP 131/85 | HR 92 | Wt 194.0 lb

## 2024-10-07 DIAGNOSIS — N898 Other specified noninflammatory disorders of vagina: Secondary | ICD-10-CM

## 2024-10-07 NOTE — Progress Notes (Signed)
 RGYN here for problem visit today note mild discomfort now notes concerns about extra skin hanging in vaginal area.    LMP:09/24/24 Contraception:None  STD Screening on 09/30/24 +BV

## 2024-10-07 NOTE — Progress Notes (Signed)
 Obstetrics and Gynecology Visit Return Patient Evaluation  Appointment Date: 10/07/2024  Primary Care Provider: Epifanio Alm SQUIBB  OBGYN Clinic: Center for [facility]  Chief Complaint: uncomfortable vaginal tissue  History of Present Illness:  Barbara Baker is a 43 y.o. had sex recently and noted discomfort and a piece of tissue at the vaginal opening  Review of Systems:  as noted in the History of Present Illness.  Patient Active Problem List   Diagnosis Date Noted   History of herpes simplex infection 04/12/2024   Psoriasis 12/09/2023   Episodic cluster headache, not intractable 03/09/2020   Prediabetes 06/02/2018   Medications:  Emilea J. Bonito had no medications administered during this visit. Current Outpatient Medications  Medication Sig Dispense Refill   Ciclopirox  1 % shampoo Apply 1 application  topically once a week. Apply to scalp let sit 5 minutes and rinse out 120 mL 11   clobetasol  (OLUX ) 0.05 % topical foam Apply topically as directed. Qd aa psoriasis on scalp up to 5 day per week, avoid face, groin, axilla 100 g 6   Fluocinolone  Acetonide Body 0.01 % OIL Apply 1 application  topically daily. Qd to aa scalp prn flares 354.84 mL 1   fluticasone  (FLONASE ) 50 MCG/ACT nasal spray Place 1 spray into both nostrils daily. 9.9 mL 2   hydrocortisone  2.5 % cream Apply topically 2 (two) times daily as needed (Rash). apply to affected areas on her face once a day Mon, Wednesday and Friday 30 g 11   hydrOXYzine  (ATARAX ) 25 MG tablet Take 1 tablet (25 mg total) by mouth every 6 (six) hours as needed for itching. 30 tablet 2   ketoconazole  (NIZORAL ) 2 % cream Apply 1 Application topically as directed. Apply to scaly areas on face at bedtime on Tuesday , Thursday and Saturdays 60 g 11   metroNIDAZOLE  (METROGEL ) 0.75 % vaginal gel Place 1 Applicatorful vaginally at bedtime. Apply one applicatorful to vagina at bedtime for 5 days 70 g 1   WEGOVY 0.25  MG/0.5ML SOAJ PLEASE SEE ATTACHED FOR DETAILED DIRECTIONS     labetalol  (NORMODYNE ) 200 MG tablet Take 1 tablet (200 mg total) by mouth every 8 (eight) hours. (Patient not taking: Reported on 10/07/2024) 90 tablet 0   loratadine (CLARITIN) 10 MG tablet Take 10 mg by mouth daily.     metroNIDAZOLE  (FLAGYL ) 500 MG tablet Take 1 tablet (500 mg total) by mouth 2 (two) times daily. 14 tablet 0   Norgestim-Eth Estrad Triphasic (NORGESTIMATE-ETHINYL ESTRADIOL TRIPHASIC) 0.18/0.215/0.25 MG-25 MCG tab Take 1 tablet by mouth daily. (Patient not taking: Reported on 10/07/2024) 28 tablet 11   Prenatal Vit-Fe Fumarate-FA (PRENATAL PO) Take by mouth. (Patient not taking: Reported on 10/07/2024)     sertraline  (ZOLOFT ) 25 MG tablet Take 1 tablet (25 mg total) by mouth daily. In 1 week if not having severe nausea or worsening anxiety symptoms, increase to 2 pills (50mg ) until seen (Patient not taking: Reported on 10/07/2024) 45 tablet 1   No current facility-administered medications for this visit.    Allergies: is allergic to nifedipine , procardia  xl [nifedipine  er], and sulfa antibiotics.  Physical Exam:  BP 131/85   Pulse 92   Wt 194 lb (88 kg)   LMP 09/22/2024   Breastfeeding No   BMI 31.31 kg/m  Body mass index is 31.31 kg/m. General appearance: Well nourished, well developed female in no acute distress.  Neuro/Psych:  Normal mood and affect.    Pelvic exam: VULVA: normal EGBUS except at  the at the right labia majora a few cm from the posterior fourchette is a tail of skin coming off the labia majora that is healing, nttp, exam chaperoned by CMA.   Assessment: pt stable  Plan:  1. Vaginal lesion (Primary) Not c/w HSV lesion. Recommend pelvic rest x 1 month to let area heal, as well as to keep area clean, particularily after BM. I told her re: risk of removal such as scarring (pt states she does keloid), dyspareunia, etc. Patient amenable to expectant management   RTC: PRN  Return if symptoms  worsen or fail to improve.  Future Appointments  Date Time Provider Department Center  11/01/2024  4:00 PM Hester Alm BROCKS, MD [facility] None  06/14/2025  8:45 AM Hester Alm BROCKS, MD [facility] None    Bebe Izell Raddle MD Attending Center for [facility])

## 2024-10-13 ENCOUNTER — Ambulatory Visit: Admitting: Obstetrics and Gynecology

## 2024-11-01 ENCOUNTER — Encounter: Payer: Self-pay | Admitting: Dermatology

## 2024-11-01 ENCOUNTER — Ambulatory Visit: Admitting: Dermatology

## 2024-11-01 DIAGNOSIS — Z79899 Other long term (current) drug therapy: Secondary | ICD-10-CM

## 2024-11-01 DIAGNOSIS — Z7189 Other specified counseling: Secondary | ICD-10-CM

## 2024-11-01 DIAGNOSIS — L910 Hypertrophic scar: Secondary | ICD-10-CM

## 2024-11-01 DIAGNOSIS — Z5111 Encounter for antineoplastic chemotherapy: Secondary | ICD-10-CM

## 2024-11-01 MED ORDER — FLUOROURACIL CHEMO INJECTION 500 MG/10ML FOR DERMATOLOGY USE
50.0000 mg | Freq: Once | INTRAVENOUS | Status: AC
  Administered 2024-11-01: 50 mg via INTRALESIONAL

## 2024-11-01 MED ORDER — TRIAMCINOLONE ACETONIDE 40 MG/ML IJ SUSP
40.0000 mg | Freq: Once | INTRAMUSCULAR | Status: AC
  Administered 2024-11-01: 40 mg via INTRADERMAL

## 2024-11-01 NOTE — Patient Instructions (Signed)

## 2024-11-01 NOTE — Progress Notes (Signed)
   Follow-Up Visit   Subjective  Barbara Baker is a 43 y.o. female who presents for the following: Keloids 37m f/u, abdomen, R side, L inframammary txted with IL Kenalog  40mg  mixed with 5FU 1:3 08/30/24  The following portions of the chart were reviewed this encounter and updated as appropriate: medications, allergies, medical history  Review of Systems:  No other skin or systemic complaints except as noted in HPI or Assessment and Plan.  Objective  Well appearing patient in no apparent distress; mood and affect are within normal limits.  A focused examination was performed of the following areas: Chest, abdomen  Relevant exam findings are noted in the Assessment and Plan.  Right Abdomen (side) - Upper Firm brown dermal papules/plaques  Assessment & Plan   KELOID Right Abdomen (side) - Upper IL injections of Kenalog  40mg  mixed with 5FU 1:3  Intralesional steroid injection side effects were reviewed including thinning of the skin and discoloration, such as redness, lightening or darkening.  Discussed with patient she can not breast feed with the 5FU IL injections Intralesional injection - Right Abdomen (side) - Upper Location:  L lateral breast 3 injections L lateral inframammary plaque 6 injections L medial inframammary 3 injections R medial inframammary 3 injections R lateral inframammary 4 injections R inframammary costal area 1 injection Umbilical 4 injections  Informed Consent: Discussed risks (infection, pain, bleeding, bruising, thinning of the skin, loss of skin pigment, lack of resolution, and recurrence of lesion) and benefits of the procedure, as well as the alternatives. Informed consent was obtained. Preparation: The area was prepared a standard fashion.  Procedure Details: An intralesional injection was performed with Kenalog  40 mg/cc, 5-fluorouracil  1:3. 9.0 cc in total were injected.  Total number of injections: 24  Plan: The patient was instructed on  post-op care. Recommend OTC analgesia as needed for pain.  Kenalog  40 Lot [id number], exp 04/30/2026 Fluorouracil   Lot [id number], exp 08/2025  Related Medications triamcinolone  acetonide (KENALOG -40) injection 40 mg  fluorouracil  (ADRUCIL ) chemo injection 50 mg   Return in about 3 months (around 01/30/2025) for Keloid f/u.  I, Grayce Saunas, RMA, am acting as scribe for Alm Rhyme, MD .   Documentation: I have reviewed the above documentation for accuracy and completeness, and I agree with the above.  Alm Rhyme, MD

## 2024-11-13 ENCOUNTER — Ambulatory Visit
Admission: EM | Admit: 2024-11-13 | Discharge: 2024-11-13 | Disposition: A | Discharge status: Home / Self Care | Attending: Emergency Medicine | Admitting: Emergency Medicine

## 2024-11-13 DIAGNOSIS — Z113 Encounter for screening for infections with a predominantly sexual mode of transmission: Secondary | ICD-10-CM

## 2024-11-13 DIAGNOSIS — N898 Other specified noninflammatory disorders of vagina: Secondary | ICD-10-CM | POA: Diagnosis not present

## 2024-11-13 LAB — POCT URINE PREGNANCY: Preg Test, Ur: NEGATIVE

## 2024-11-13 NOTE — ED Triage Notes (Signed)
 Pt being seen in UC for STD testing. Pt mentioned possible yeast infection, pt recently treated for BV. Pt denies known exposure of STD. Pt reports having dark colored discharged. Pt reports having different menstrual cycles since plan B  pill was taken end of November, pt requesting pregnancy test.

## 2024-11-13 NOTE — Discharge Instructions (Signed)
 Your tests are pending.  Your test results will be available on your MyChart account.  If your test results are positive, we will call you.  You and your sexual partner(s) may require treatment at that time.  Do not have sexual activity until all test results are back and treatment is completed if needed.  Follow-up with your primary care provider or gynecologist.

## 2024-11-13 NOTE — ED Provider Notes (Signed)
 [facility]    CSN: [id number] Arrival date & time: 11/13/24  1027      History   Chief Complaint Chief Complaint  Patient presents with   SEXUALLY TRANSMITTED DISEASE    HPI Barbara Baker is a 43 y.o. female.  Patient presents with vaginal discharge x 1 week.  She requests STD testing, as well as BV and yeast.  She also request blood work for HIV and syphilis.  She states she was treated for bacterial vaginitis last week with MetroGel  which was prescribed by her gynecologist.  Patient also requests a pregnancy test.  She denies lesions, rash, pelvic pain, dysuria, hematuria, abdominal pain.  The history is provided by the patient and medical records.    Past Medical History:  Diagnosis Date   Allergic rhinitis 04/03/2022   Dermatitis    Seborrheic dermatitis   Episodic cluster headache, not intractable 03/09/2020   Gestational hypertension without significant proteinuria 04/06/2024   Headache    Psoriasis     Patient Active Problem List   Diagnosis Date Noted   History of herpes simplex infection 04/12/2024   Psoriasis 12/09/2023   Episodic cluster headache, not intractable 03/09/2020   Prediabetes 06/02/2018    Past Surgical History:  Procedure Laterality Date   BREAST BIOPSY Left 04/30/2022   stereo biopsy/x clip. path pending   BREAST REDUCTION SURGERY Bilateral 06/20/2022   Procedure: MAMMARY REDUCTION  (BREAST);  Surgeon: Arelia Filippo, MD;  Location: [facility];  Service: Plastics;  Laterality: Bilateral;   DILATION AND CURETTAGE OF UTERUS      OB History     Gravida  3   Para  2   Term  1   Preterm  1   AB  1   Living  2      SAB  1   IAB      Ectopic      Multiple  0   Live Births  2        Obstetric Comments  2009  PTL, rec'd BMZ          Home Medications    Prior to Admission medications  Medication Sig Start Date End Date Taking? Authorizing Provider  Ciclopirox  1 % shampoo Apply 1  application  topically once a week. Apply to scalp let sit 5 minutes and rinse out 06/14/24   Hester Alm BROCKS, MD  clobetasol  (OLUX ) 0.05 % topical foam Apply topically as directed. Qd aa psoriasis on scalp up to 5 day per week, avoid face, groin, axilla 06/14/24   Hester Alm BROCKS, MD  Fluocinolone  Acetonide Body 0.01 % OIL Apply 1 application  topically daily. Qd to aa scalp prn flares 09/01/24   Hester Alm BROCKS, MD  fluticasone  (FLONASE ) 50 MCG/ACT nasal spray Place 1 spray into both nostrils daily. 11/23/23   Anyanwu, Ugonna A, MD  hydrocortisone  2.5 % cream Apply topically 2 (two) times daily as needed (Rash). apply to affected areas on her face once a day Mon, Wednesday and Friday 06/14/24   Hester Alm BROCKS, MD  hydrOXYzine  (ATARAX ) 25 MG tablet Take 1 tablet (25 mg total) by mouth every 6 (six) hours as needed for itching. Patient not taking: Reported on 11/13/2024 07/01/24   Eldonna Suzen Octave, MD  ketoconazole  (NIZORAL ) 2 % cream Apply 1 Application topically as directed. Apply to scaly areas on face at bedtime on Tuesday , Thursday and Saturdays 06/14/24 08/08/25  Hester Alm BROCKS, MD  labetalol  (NORMODYNE ) 200  MG tablet Take 1 tablet (200 mg total) by mouth every 8 (eight) hours. Patient not taking: Reported on 10/07/2024 05/18/24 07/01/24  Izell Harari, MD  loratadine (CLARITIN) 10 MG tablet Take 10 mg by mouth daily. 03/10/20   [provider]  metroNIDAZOLE  (FLAGYL ) 500 MG tablet Take 1 tablet (500 mg total) by mouth 2 (two) times daily. 07/06/24   Eldonna Suzen Octave, MD  metroNIDAZOLE  (METROGEL ) 0.75 % vaginal gel Place 1 Applicatorful vaginally at bedtime. Apply one applicatorful to vagina at bedtime for 5 days 07/05/24   Eldonna Suzen Octave, MD  Norgestim-Eth Marton Triphasic (NORGESTIMATE-ETHINYL ESTRADIOL TRIPHASIC) 0.18/0.215/0.25 MG-25 MCG tab Take 1 tablet by mouth daily. Patient not taking: Reported on 10/07/2024 07/01/24   Eldonna Suzen Octave, MD  Prenatal Vit-Fe  Fumarate-FA (PRENATAL PO) Take by mouth. Patient not taking: Reported on 10/07/2024    [provider]  sertraline  (ZOLOFT ) 25 MG tablet Take 1 tablet (25 mg total) by mouth daily. In 1 week if not having severe nausea or worsening anxiety symptoms, increase to 2 pills (50mg ) until seen Patient not taking: Reported on 10/07/2024 07/01/24   Eldonna Suzen Octave, MD  WEGOVY 0.25 MG/0.5ML SOAJ PLEASE SEE ATTACHED FOR DETAILED DIRECTIONS 06/15/24   [provider]    Family History Family History  Problem Relation Age of Onset   Ovarian cancer Mother 15   Asthma Mother    Sarcoidosis Mother    Other Mother        kidney issues   Heart failure Father    Diabetes Father    Hypertension Father    Psoriasis Son    Eczema Son     Social History Social History[1]   Allergies   Nifedipine , Procardia  xl [nifedipine  er], and Sulfa antibiotics   Review of Systems Review of Systems  Constitutional:  Negative for chills and fever.  Gastrointestinal:  Negative for abdominal pain.  Genitourinary:  Positive for vaginal discharge. Negative for dysuria, hematuria and pelvic pain.     Physical Exam Triage Vital Signs ED Triage Vitals [11/13/24 1239]  Encounter Vitals Group     BP      Girls Systolic BP Percentile      Girls Diastolic BP Percentile      Boys Systolic BP Percentile      Boys Diastolic BP Percentile      Pulse      Resp      Temp      Temp src      SpO2      Weight      Height      Head Circumference      Peak Flow      Pain Score 0     Pain Loc      Pain Education      Exclude from Growth Chart    No data found.  Updated Vital Signs BP 109/78   Pulse 95   Temp 97.8 F (36.6 C)   Resp 18   LMP  (LMP Unknown)   SpO2 98%   Breastfeeding No   Visual Acuity Right Eye Distance:   Left Eye Distance:   Bilateral Distance:    Right Eye Near:   Left Eye Near:    Bilateral Near:     Physical Exam Constitutional:      General: She is not  in acute distress. HENT:     Mouth/Throat:     Mouth: Mucous membranes are moist.  Cardiovascular:  Rate and Rhythm: Normal rate.  Pulmonary:     Effort: Pulmonary effort is normal. No respiratory distress.  Abdominal:     General: Bowel sounds are normal.     Palpations: Abdomen is soft.     Tenderness: There is no abdominal tenderness. There is no right CVA tenderness, left CVA tenderness, guarding or rebound.  Genitourinary:    Comments: Patient declines GU exam. Neurological:     Mental Status: She is alert.      UC Treatments / Results  Labs (all labs ordered are listed, but only abnormal results are displayed) Labs Reviewed  SYPHILIS: RPR W/REFLEX TO RPR TITER AND TREPONEMAL ANTIBODIES, TRADITIONAL SCREENING AND DIAGNOSIS ALGORITHM  HIV ANTIBODY (ROUTINE TESTING W REFLEX)  POCT URINE PREGNANCY  CERVICOVAGINAL ANCILLARY ONLY    EKG   Radiology No results found.  Procedures Procedures (including critical care time)  Medications Ordered in UC Medications - No data to display  Initial Impression / Assessment and Plan / UC Course  I have reviewed the triage vital signs and the nursing notes.  Pertinent labs & imaging results that were available during my care of the patient were reviewed by me and considered in my medical decision making (see chart for details).    Vaginal discharge, STD screening.  Patient reports she was treated with MetroGel  for BV last week which was prescribed by her gynecologist.  She reports vaginal discharge for the last week.  Patient obtained self swab for testing.  Lab work also pending.  Discussed that all test results will be available in MyChart and we will call if test results are positive.  Discussed that she may require treatment at that time and that sexual partner(s) may also require treatment.  Instructed patient to abstain from sexual activity until all test results are back and treatment is completed if needed.  Instructed her  to follow-up with her PCP or gynecologist.  Patient agrees to plan of care.   Final Clinical Impressions(s) / UC Diagnoses   Final diagnoses:  Vaginal discharge  Screening for STD (sexually transmitted disease)     Discharge Instructions      Your tests are pending.  Your test results will be available on your MyChart account.  If your test results are positive, we will call you.  You and your sexual partner(s) may require treatment at that time.  Do not have sexual activity until all test results are back and treatment is completed if needed.  Follow-up with your primary care provider or gynecologist.     ED Prescriptions   None    PDMP not reviewed this encounter.    [1]  Social History Tobacco Use   Smoking status: Never   Smokeless tobacco: Never  Vaping Use   Vaping status: Never Used  Substance Use Topics   Alcohol use: Never   Drug use: Never     Corlis Burnard DEL, NP 11/13/24 1307

## 2024-11-14 LAB — CERVICOVAGINAL ANCILLARY ONLY
Bacterial Vaginitis (gardnerella): NEGATIVE
Candida Glabrata: NEGATIVE
Candida Vaginitis: NEGATIVE
Chlamydia: NEGATIVE
Comment: NEGATIVE
Comment: NEGATIVE
Comment: NEGATIVE
Comment: NEGATIVE
Comment: NEGATIVE
Comment: NORMAL
Neisseria Gonorrhea: NEGATIVE
Trichomonas: NEGATIVE

## 2024-11-15 LAB — SYPHILIS: RPR W/REFLEX TO RPR TITER AND TREPONEMAL ANTIBODIES, TRADITIONAL SCREENING AND DIAGNOSIS ALGORITHM: RPR Ser Ql: NONREACTIVE

## 2024-11-15 LAB — HIV ANTIBODY (ROUTINE TESTING W REFLEX): HIV Screen 4th Generation wRfx: NONREACTIVE

## 2025-02-13 ENCOUNTER — Ambulatory Visit: Admitting: Dermatology

## 2025-06-14 ENCOUNTER — Ambulatory Visit: Admitting: Dermatology
